# Patient Record
Sex: Male | Born: 1990 | Race: White | Hispanic: No | Marital: Single | Smoking: Current every day smoker
Health system: Southern US, Community
[De-identification: ages and names within clinical notes are randomized; demographics above are authoritative.]

## PROBLEM LIST (undated history)

## (undated) DIAGNOSIS — R569 Unspecified convulsions: Secondary | ICD-10-CM

## (undated) DIAGNOSIS — K2970 Gastritis, unspecified, without bleeding: Secondary | ICD-10-CM

## (undated) HISTORY — PX: TONSILLECTOMY: SUR1361

## (undated) HISTORY — DX: Gastritis, unspecified, without bleeding: K29.70

---
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
## 2009-01-29 ENCOUNTER — Emergency Department: Payer: Self-pay

## 2009-02-09 ENCOUNTER — Emergency Department (HOSPITAL_COMMUNITY): Admission: EM | Admit: 2009-02-09 | Discharge: 2009-02-09 | Payer: Self-pay | Admitting: Emergency Medicine

## 2009-07-04 ENCOUNTER — Inpatient Hospital Stay: Payer: Self-pay | Admitting: Internal Medicine

## 2009-07-06 ENCOUNTER — Inpatient Hospital Stay: Payer: Self-pay | Admitting: Psychiatry

## 2009-07-19 ENCOUNTER — Other Ambulatory Visit: Payer: Self-pay | Admitting: Psychiatry

## 2009-08-27 ENCOUNTER — Emergency Department: Payer: Self-pay | Admitting: Emergency Medicine

## 2009-10-22 ENCOUNTER — Emergency Department (HOSPITAL_COMMUNITY)
Admission: EM | Admit: 2009-10-22 | Discharge: 2009-10-23 | Payer: Self-pay | Source: Home / Self Care | Admitting: Emergency Medicine

## 2009-11-10 ENCOUNTER — Ambulatory Visit: Payer: Self-pay | Admitting: Unknown Physician Specialty

## 2009-11-19 ENCOUNTER — Ambulatory Visit: Payer: Self-pay | Admitting: Unknown Physician Specialty

## 2010-05-04 LAB — RAPID URINE DRUG SCREEN, HOSP PERFORMED
Barbiturates: NOT DETECTED
Benzodiazepines: NOT DETECTED

## 2010-05-04 LAB — URINE MICROSCOPIC-ADD ON

## 2010-05-04 LAB — DIFFERENTIAL
Basophils Absolute: 0 10*3/uL (ref 0.0–0.1)
Eosinophils Relative: 2 % (ref 0–5)
Lymphocytes Relative: 15 % (ref 12–46)
Lymphs Abs: 2.2 10*3/uL (ref 0.7–4.0)
Neutrophils Relative %: 76 % (ref 43–77)

## 2010-05-04 LAB — COMPREHENSIVE METABOLIC PANEL
AST: 23 U/L (ref 0–37)
BUN: 6 mg/dL (ref 6–23)
CO2: 23 mEq/L (ref 19–32)
Calcium: 8.8 mg/dL (ref 8.4–10.5)
Chloride: 106 mEq/L (ref 96–112)
Creatinine, Ser: 1.14 mg/dL (ref 0.4–1.5)
GFR calc Af Amer: >60 mL/min (ref >60)
GFR calc non Af Amer: >60 mL/min (ref >60)
Glucose, Bld: 68 mg/dL — ABNORMAL LOW (ref 70–99)
Total Bilirubin: 0.5 mg/dL (ref 0.3–1.2)

## 2010-05-04 LAB — URINALYSIS, ROUTINE W REFLEX MICROSCOPIC
Bilirubin Urine: NEGATIVE
Glucose, UA: NEGATIVE mg/dL
Hgb urine dipstick: NEGATIVE
Specific Gravity, Urine: 1.019 (ref 1.005–1.030)
Urobilinogen, UA: 1 mg/dL (ref 0.0–1.0)

## 2010-05-04 LAB — CBC
HCT: 44.2 % (ref 39.0–52.0)
Hemoglobin: 15.9 g/dL (ref 13.0–17.0)
MCH: 32.6 pg (ref 26.0–34.0)
MCHC: 36 g/dL (ref 30.0–36.0)
MCV: 90.6 fL (ref 78.0–100.0)
RBC: 4.88 MIL/uL (ref 4.22–5.81)

## 2010-05-04 LAB — ETHANOL: Alcohol, Ethyl (B): <5 mg/dL (ref 0–10)

## 2010-05-22 LAB — RAPID URINE DRUG SCREEN, HOSP PERFORMED
Amphetamines: NOT DETECTED
Barbiturates: NOT DETECTED
Opiates: NOT DETECTED
Tetrahydrocannabinol: POSITIVE — AB

## 2010-05-22 LAB — DIFFERENTIAL
Basophils Absolute: 0.1 10*3/uL (ref 0.0–0.1)
Lymphocytes Relative: 12 % (ref 12–46)
Lymphs Abs: 1.5 10*3/uL (ref 0.7–4.0)
Neutrophils Relative %: 82 % — ABNORMAL HIGH (ref 43–77)

## 2010-05-22 LAB — COMPREHENSIVE METABOLIC PANEL
BUN: 1 mg/dL — ABNORMAL LOW (ref 6–23)
CO2: 25 mEq/L (ref 19–32)
Calcium: 9 mg/dL (ref 8.4–10.5)
Chloride: 106 mEq/L (ref 96–112)
Creatinine, Ser: 0.84 mg/dL (ref 0.4–1.5)
GFR calc Af Amer: >60 mL/min (ref >60)
GFR calc non Af Amer: >60 mL/min (ref >60)
Glucose, Bld: 92 mg/dL (ref 70–99)
Total Bilirubin: 0.5 mg/dL (ref 0.3–1.2)

## 2010-05-22 LAB — CBC
HCT: 44.5 % (ref 39.0–52.0)
Hemoglobin: 15.1 g/dL (ref 13.0–17.0)
MCHC: 34 g/dL (ref 30.0–36.0)
MCV: 92.5 fL (ref 78.0–100.0)
RBC: 4.81 MIL/uL (ref 4.22–5.81)

## 2010-05-22 LAB — GLUCOSE, CAPILLARY: Glucose-Capillary: 93 mg/dL (ref 70–99)

## 2010-05-27 LAB — BASIC METABOLIC PANEL
BUN: 7 mg/dL (ref 6–23)
CO2: 24 mEq/L (ref 19–32)
GFR calc non Af Amer: >60 mL/min (ref >60)
Glucose, Bld: 86 mg/dL (ref 70–99)
Potassium: 4 mEq/L (ref 3.5–5.1)
Sodium: 137 mEq/L (ref 135–145)

## 2010-05-27 LAB — URINALYSIS, ROUTINE W REFLEX MICROSCOPIC
Bilirubin Urine: NEGATIVE
Nitrite: NEGATIVE
Specific Gravity, Urine: 1.013 (ref 1.005–1.030)
Urobilinogen, UA: 0.2 mg/dL (ref 0.0–1.0)
pH: 5.5 (ref 5.0–8.0)

## 2010-05-27 LAB — RAPID URINE DRUG SCREEN, HOSP PERFORMED
Amphetamines: NOT DETECTED
Barbiturates: NOT DETECTED
Opiates: POSITIVE — AB

## 2010-05-27 LAB — DIFFERENTIAL
Basophils Absolute: 0.1 10*3/uL (ref 0.0–0.1)
Basophils Relative: 1 % (ref 0–1)
Eosinophils Absolute: 0.2 10*3/uL (ref 0.0–0.7)
Eosinophils Relative: 2 % (ref 0–5)
Monocytes Absolute: 0.4 10*3/uL (ref 0.1–1.0)

## 2010-05-27 LAB — CBC
HCT: 42.6 % (ref 39.0–52.0)
Hemoglobin: 14.9 g/dL (ref 13.0–17.0)
MCHC: 35.1 g/dL (ref 30.0–36.0)
MCV: 93.2 fL (ref 78.0–100.0)
Platelets: 172 10*3/uL (ref 150–400)
RDW: 12.8 % (ref 11.5–15.5)

## 2010-05-27 LAB — URINE MICROSCOPIC-ADD ON

## 2011-01-13 ENCOUNTER — Observation Stay (HOSPITAL_COMMUNITY)
Admission: EM | Admit: 2011-01-13 | Discharge: 2011-01-15 | DRG: 182 | Disposition: A | Discharge status: Home / Self Care | Payer: BC Managed Care – PPO | Attending: Internal Medicine | Admitting: Internal Medicine

## 2011-01-13 ENCOUNTER — Encounter: Payer: Self-pay | Admitting: *Deleted

## 2011-01-13 DIAGNOSIS — E871 Hypo-osmolality and hyponatremia: Secondary | ICD-10-CM | POA: Insufficient documentation

## 2011-01-13 DIAGNOSIS — E860 Dehydration: Secondary | ICD-10-CM

## 2011-01-13 DIAGNOSIS — F17200 Nicotine dependence, unspecified, uncomplicated: Secondary | ICD-10-CM | POA: Diagnosis present

## 2011-01-13 DIAGNOSIS — E876 Hypokalemia: Secondary | ICD-10-CM

## 2011-01-13 DIAGNOSIS — E878 Other disorders of electrolyte and fluid balance, not elsewhere classified: Secondary | ICD-10-CM | POA: Diagnosis present

## 2011-01-13 DIAGNOSIS — R1110 Vomiting, unspecified: Secondary | ICD-10-CM

## 2011-01-13 DIAGNOSIS — G40909 Epilepsy, unspecified, not intractable, without status epilepticus: Secondary | ICD-10-CM | POA: Insufficient documentation

## 2011-01-13 DIAGNOSIS — K2900 Acute gastritis without bleeding: Principal | ICD-10-CM | POA: Diagnosis present

## 2011-01-13 HISTORY — DX: Unspecified convulsions: R56.9

## 2011-01-13 LAB — COMPREHENSIVE METABOLIC PANEL
ALT: 26 U/L (ref 0–53)
AST: 18 U/L (ref 0–37)
Albumin: 5.4 g/dL — ABNORMAL HIGH (ref 3.5–5.2)
Calcium: 10.6 mg/dL — ABNORMAL HIGH (ref 8.4–10.5)
Chloride: 80 mEq/L — ABNORMAL LOW (ref 96–112)
Creatinine, Ser: 1.13 mg/dL (ref 0.50–1.35)
Sodium: 126 mEq/L — ABNORMAL LOW (ref 135–145)
Total Bilirubin: 0.9 mg/dL (ref 0.3–1.2)

## 2011-01-13 LAB — URINALYSIS, ROUTINE W REFLEX MICROSCOPIC
Glucose, UA: NEGATIVE mg/dL
Hgb urine dipstick: NEGATIVE
Leukocytes, UA: NEGATIVE
pH: 5.5 (ref 5.0–8.0)

## 2011-01-13 LAB — DIFFERENTIAL
Basophils Absolute: 0 10*3/uL (ref 0.0–0.1)
Basophils Relative: 0 % (ref 0–1)
Lymphocytes Relative: 12 % (ref 12–46)
Monocytes Absolute: 1.2 10*3/uL — ABNORMAL HIGH (ref 0.1–1.0)
Neutro Abs: 13.1 10*3/uL — ABNORMAL HIGH (ref 1.7–7.7)
Neutrophils Relative %: 80 % — ABNORMAL HIGH (ref 43–77)

## 2011-01-13 LAB — CBC
HCT: 46.7 % (ref 39.0–52.0)
MCHC: 38.1 g/dL — ABNORMAL HIGH (ref 30.0–36.0)
Platelets: 310 10*3/uL (ref 150–400)
RDW: 12.2 % (ref 11.5–15.5)
WBC: 16.3 10*3/uL — ABNORMAL HIGH (ref 4.0–10.5)

## 2011-01-13 MED ORDER — ACETAMINOPHEN 325 MG PO TABS: 650.0000 mg | ORAL_TABLET | Freq: Four times a day (QID) | ORAL | Status: DC | PRN

## 2011-01-13 MED ORDER — POTASSIUM CHLORIDE 10 MEQ/100ML IV SOLN
10.0000 meq | INTRAVENOUS | Status: AC
  Administered 2011-01-13 – 2011-01-14 (×4): 10 meq via INTRAVENOUS
  Filled 2011-01-13 (×4): qty 100

## 2011-01-13 MED ORDER — MORPHINE SULFATE 4 MG/ML IJ SOLN
4.0000 mg | Freq: Once | INTRAMUSCULAR | Status: AC
  Administered 2011-01-13: 4 mg via INTRAVENOUS
  Filled 2011-01-13: qty 1

## 2011-01-13 MED ORDER — SODIUM CHLORIDE 0.9 % IV BOLUS (SEPSIS)
1500.0000 mL | Freq: Once | INTRAVENOUS | Status: AC
  Administered 2011-01-13: 1000 mL via INTRAVENOUS

## 2011-01-13 MED ORDER — ACETAMINOPHEN 650 MG RE SUPP: 650.0000 mg | Freq: Four times a day (QID) | RECTAL | Status: DC | PRN

## 2011-01-13 MED ORDER — PANTOPRAZOLE SODIUM 40 MG IV SOLR
40.0000 mg | Freq: Every day | INTRAVENOUS | Status: DC
  Administered 2011-01-13 – 2011-01-14 (×2): 40 mg via INTRAVENOUS
  Filled 2011-01-13 (×3): qty 40

## 2011-01-13 MED ORDER — NICOTINE 14 MG/24HR TD PT24
14.0000 mg | MEDICATED_PATCH | Freq: Every day | TRANSDERMAL | Status: DC
  Administered 2011-01-14: 14 mg via TRANSDERMAL
  Filled 2011-01-13 (×2): qty 1

## 2011-01-13 MED ORDER — ONDANSETRON HCL 4 MG/2ML IJ SOLN
4.0000 mg | Freq: Four times a day (QID) | INTRAMUSCULAR | Status: DC | PRN
  Administered 2011-01-13 – 2011-01-14 (×3): 4 mg via INTRAVENOUS
  Filled 2011-01-13 (×3): qty 2

## 2011-01-13 MED ORDER — POTASSIUM CHLORIDE 10 MEQ/100ML IV SOLN
10.0000 meq | INTRAVENOUS | Status: AC
  Administered 2011-01-13 (×2): 10 meq via INTRAVENOUS
  Filled 2011-01-13 (×2): qty 100

## 2011-01-13 MED ORDER — HYDROMORPHONE HCL PF 1 MG/ML IJ SOLN
1.0000 mg | INTRAMUSCULAR | Status: DC | PRN
  Administered 2011-01-13 – 2011-01-14 (×6): 1 mg via INTRAVENOUS
  Filled 2011-01-13 (×6): qty 1

## 2011-01-13 MED ORDER — SODIUM CHLORIDE 0.9 % IV SOLN
INTRAVENOUS | Status: DC
  Administered 2011-01-14 – 2011-01-15 (×3): via INTRAVENOUS

## 2011-01-13 MED ORDER — ONDANSETRON HCL 4 MG PO TABS: 4.0000 mg | ORAL_TABLET | Freq: Four times a day (QID) | ORAL | Status: DC | PRN

## 2011-01-13 MED ORDER — ONDANSETRON HCL 4 MG/2ML IJ SOLN
8.0000 mg | INTRAMUSCULAR | Status: DC | PRN
  Administered 2011-01-13 (×2): 8 mg via INTRAVENOUS
  Filled 2011-01-13: qty 4
  Filled 2011-01-13: qty 2

## 2011-01-13 MED ORDER — SODIUM CHLORIDE 0.9 % IV SOLN
INTRAVENOUS | Status: DC
  Administered 2011-01-13: 14:00:00 via INTRAVENOUS

## 2011-01-13 MED ORDER — ENOXAPARIN SODIUM 40 MG/0.4ML ~~LOC~~ SOLN
40.0000 mg | SUBCUTANEOUS | Status: DC
  Administered 2011-01-13 – 2011-01-14 (×2): 40 mg via SUBCUTANEOUS
  Filled 2011-01-13 (×4): qty 0.4

## 2011-01-13 NOTE — ED Notes (Signed)
Pt with N/V for 3 days, chills with symptoms, has not been able to keep liquids in for 3 days

## 2011-01-13 NOTE — H&P (Signed)
PCP:   No primary provider on file.  Primary Neurologist: [facility].   Chief Complaint:  Vomiting and abdominal pain for 3 days.  HPI: This is a 20year male, with known history of seizure disorder diagnosed approximately 2 years ago, s/p tonsilectomy at age 20 years, presenting with a three-day history of vomiting, approximately 6-7 times per day, associated with abdominal pain non-radiating, constant non-fluctuating. He had no associated diarrhea, no history of sick contacts, clustering of cases, or antecedent ingestion of unusual foods or recent travel. He has been unable to keep down any food in that period of time and has come to the emergency department, because of persisting symptoms.  Allergies:  No Known Allergies    Past Medical History  Diagnosis Date  . Seizures     History reviewed. No pertinent past surgical history.  Prior to Admission medications   Medication Sig Start Date End Date Taking? Authorizing Provider  promethazine (PHENERGAN) 25 MG tablet Take 25 mg by mouth every 6 (six) hours as needed.     Yes Historical Provider, MD    Social History: Smokes half a pack of cigarettes per day.  He does not have any smokeless tobacco history on file. He reports that he does not drink alcohol or use illicit drugs. He is single, unemployed, has no offspring and live s with his mother. Parents were separated about 2 years ago.  Family History: Mother is aged 59 years, with COPD, father is aged 33 year, and is alive and well.  Review of Systems:  As per HPI and chief complaint. Patent feels weak, has diminished appetite, no weight loss or fever. Had chills 3 days, ago. Denies headache, blurred vision, difficulty in speaking, dysphagia, chest pain, cough, shortness of breath, orthopnea, paroxysmal nocturnal dyspnea, nausea, diaphoresis, diarrhea, hematemesis, melena, dysuria, nocturia, urinary frequency, hematochezia, lower extremity swelling, pain, or  redness. The rest of the systems review is negative.  Physical Exam:  General:  Patient does not appear to be in obvious acute distress. Alert, communicative, fully oriented, talking in complete sentences, not short of breath at rest.  HEENT:  No clinical pallor, no jaundice, no conjunctival injection or discharge. Visible mucosae appear dry. NECK:  Supple, JVP not seen, no carotid bruits, no palpable lymphadenopathy, no palpable goiter. CHEST:  Clinically clear to auscultation, no wheezes, no crackles. HEART:  Sounds 1 and 2 heard, normal, regular, no murmurs. ABDOMEN:  Flat, soft, non-tender, no palpable organomegaly, no palpable masses, normal bowel sounds. GENITALIA:  Not examined. LOWER EXTREMITIES:  No pitting edema, palpable peripheral pulses. MUSCULOSKELETAL SYSTEM:  Unremarkable. CENTRAL NERVOUS SYSTEM:  No focal neurologic deficit on gross examination.  Labs on Admission:  Results for orders placed during the hospital encounter of 01/13/11 (from the past 48 hour(s))  URINALYSIS, ROUTINE W REFLEX MICROSCOPIC     Status: Abnormal   Collection Time   01/13/11 12:09 PM      Component Value Range Comment   Color, Urine YELLOW  YELLOW     Appearance CLEAR  CLEAR     Specific Gravity, Urine 1.028  1.005 - 1.030     pH 5.5  5.0 - 8.0     Glucose, UA NEGATIVE  NEGATIVE (mg/dL)    Hgb urine dipstick NEGATIVE  NEGATIVE     Bilirubin Urine SMALL (*) NEGATIVE     Ketones, ur >80 (*) NEGATIVE (mg/dL)    Protein, ur NEGATIVE  NEGATIVE (mg/dL)    Urobilinogen, UA 0.2  0.0 -  1.0 (mg/dL)    Nitrite NEGATIVE  NEGATIVE     Leukocytes, UA NEGATIVE  NEGATIVE  MICROSCOPIC NOT DONE ON URINES WITH NEGATIVE PROTEIN, BLOOD, LEUKOCYTES, NITRITE, OR GLUCOSE <1000 mg/dL.  CBC     Status: Abnormal   Collection Time   01/13/11 12:30 PM      Component Value Range Comment   WBC 16.3 (*) 4.0 - 10.5 (K/uL)    RBC 5.65  4.22 - 5.81 (MIL/uL)    Hemoglobin 17.8 (*) 13.0 - 17.0 (g/dL)    HCT 78.2  95.6 -  21.3 (%)    MCV 82.7  78.0 - 100.0 (fL)    MCH 31.5  26.0 - 34.0 (pg)    MCHC 38.1 (*) 30.0 - 36.0 (g/dL)    RDW 08.6  57.8 - 46.9 (%)    Platelets 310  150 - 400 (K/uL)   DIFFERENTIAL     Status: Abnormal   Collection Time   01/13/11 12:30 PM      Component Value Range Comment   Neutrophils Relative 80 (*) 43 - 77 (%)    Neutro Abs 13.1 (*) 1.7 - 7.7 (K/uL)    Lymphocytes Relative 12  12 - 46 (%)    Lymphs Abs 2.0  0.7 - 4.0 (K/uL)    Monocytes Relative 7  3 - 12 (%)    Monocytes Absolute 1.2 (*) 0.1 - 1.0 (K/uL)    Eosinophils Relative 0  0 - 5 (%)    Eosinophils Absolute 0.0  0.0 - 0.7 (K/uL)    Basophils Relative 0  0 - 1 (%)    Basophils Absolute 0.0  0.0 - 0.1 (K/uL)   COMPREHENSIVE METABOLIC PANEL     Status: Abnormal   Collection Time   01/13/11 12:30 PM      Component Value Range Comment   Sodium 126 (*) 135 - 145 (mEq/L)    Potassium 3.0 (*) 3.5 - 5.1 (mEq/L)    Chloride 80 (*) 96 - 112 (mEq/L)    CO2 25  19 - 32 (mEq/L)    Glucose, Bld 105 (*) 70 - 99 (mg/dL)    BUN 39 (*) 6 - 23 (mg/dL)    Creatinine, Ser 6.29  0.50 - 1.35 (mg/dL)    Calcium 52.8 (*) 8.4 - 10.5 (mg/dL)    Total Protein 8.7 (*) 6.0 - 8.3 (g/dL)    Albumin 5.4 (*) 3.5 - 5.2 (g/dL)    AST 18  0 - 37 (U/L)    ALT 26  0 - 53 (U/L)    Alkaline Phosphatase 74  39 - 117 (U/L)    Total Bilirubin 0.9  0.3 - 1.2 (mg/dL)    GFR calc non Af Amer >90  >90 (mL/min)    GFR calc Af Amer >90  >90 (mL/min)   LIPASE, BLOOD     Status: Abnormal   Collection Time   01/13/11 12:30 PM      Component Value Range Comment   Lipase 9 (*) 11 - 59 (U/L)     Radiological Exams on Admission: No results found.  Assessment/Plan *Principal Problem* Acute Gastritis: Patient presents with acute gastritis. Suspect viral etiology, and although patient has elevated WCC and HB, this is likely due to dehydration and hemoconcentration. Lipase is normal at 9, effectively excluding acute pancreatitis. We shall admit patient  because of persistent symptomatology, commence bowel rest,  intravenous fluid hydration, proton pump inhibitor and antiemetics. Active hospital problems. 1. Dehydration: As evidenced by elevated BUN/Creatinine  ratio. This will be adequately addressed with iv fluids, as described above. 2. Electrolyte abnormalities. Patient has hyponatremia, hypokalemia and hypochloremia, all secondary to dehydration and volume depletion. These will be addressed with normal saline infusion, as well as potassium supplements. For completeness, we shall check magnesium levels, and replete if indicated. 3. Seizure disorder. Patient is asymptomatic. He has been seizure-free for approximately one year, and was compliant with medication, up to 3 days ago, but has had no anticonvulsants since. Unfortunately, he is sure of neither the name or dose of his medication. We shall place him on seizure precautions, and re-start anticonvulsants, as soon as these are known. 4. Smoking history: Patient has been counseled approriately, and will be managed with Nicoderm CQ patch, during his hospitalization.  Time Spent on Admission: 1 hour.  Trany Chernick,CHRISTOPHER 01/13/2011, 4:37 PM

## 2011-01-13 NOTE — ED Notes (Signed)
Pt. With abdominal pain. [facility] called for pain medication

## 2011-01-13 NOTE — ED Provider Notes (Cosign Needed)
History     CSN: [id number] Arrival date & time: 01/13/2011 10:27 AM   First MD Initiated Contact with Patient 01/13/11 1127      Chief Complaint  Patient presents with  . Nausea  . Abdominal Pain    (Consider location/radiation/quality/duration/timing/severity/associated sxs/prior treatment) HPI  Relates for the past 3 days he's had nausea and vomiting every 2 hours. He denies diarrhea. He states he has some upper nominal discomfort that is dull and diffuse and constant. He states he feels hot and cold and is unsure if he is having fever. He states he feels very weak and lightheaded. He denies cough shortness of breath chest pain. Nothing makes him feel worse. Mother has been giving him her prescription Phenergan oral and suppositories without relief. He states no new exposures and states nobody else is sick that he's been around.  Primary care doctor none  Past Medical History  Diagnosis Date  . Seizures    Medications none  History reviewed. No pertinent past surgical history.  No family history on file.  History  Substance Use Topics  . Smoking status: Current Everyday Smoker  . Smokeless tobacco: Not on file  . Alcohol Use: No   Unemployed   Review of Systems  All other systems reviewed and are negative.    Allergies  Review of patient's allergies indicates no known allergies.  Home Medications   Current Outpatient Rx  Name Route Sig Dispense Refill  . PROMETHAZINE HCL 25 MG PO TABS Oral Take 25 mg by mouth every 6 (six) hours as needed.        BP 158/93  Pulse 89  Temp(Src) 98.3 F (36.8 C) (Oral)  Resp 20  SpO2 97%  Vital signs normal  Physical Exam  Vitals reviewed. Constitutional: He is oriented to person, place, and time. He appears well-developed and well-nourished.  HENT:  Head: Normocephalic and atraumatic.  Right Ear: External ear normal.  Left Ear: External ear normal.       It is membranes are dry otherwise normal  Eyes:  Conjunctivae and EOM are normal. Pupils are equal, round, and reactive to light.  Neck: Normal range of motion. Neck supple.  Cardiovascular: Normal rate, regular rhythm and normal heart sounds.   Pulmonary/Chest: Effort normal and breath sounds normal.  Abdominal: Soft. Bowel sounds are normal.       Mild tenderness in the upper abdomen without guarding or rebound  Musculoskeletal: Normal range of motion.  Neurological: He is alert and oriented to person, place, and time.  Skin: Skin is warm and dry.  Psychiatric:       Affect is very flat    ED Course  Procedures (including critical care time)  Patient given IV fluids IV nausea medicine. After reviewing his labs he was started on IV potassium for his hypokalemia. He has significant electrolyte abnormalities it was felt he should be in for further treatment.  1528 Dr. Brien Few accepts for admission to a medical bed team two  Results for orders placed during the hospital encounter of 01/13/11  CBC      Component Value Range   WBC 16.3 (*) 4.0 - 10.5 (K/uL)   RBC 5.65  4.22 - 5.81 (MIL/uL)   Hemoglobin 17.8 (*) 13.0 - 17.0 (g/dL)   HCT 30.8  65.7 - 84.6 (%)   MCV 82.7  78.0 - 100.0 (fL)   MCH 31.5  26.0 - 34.0 (pg)   MCHC 38.1 (*) 30.0 - 36.0 (g/dL)   RDW  12.2  11.5 - 15.5 (%)   Platelets 310  150 - 400 (K/uL)  DIFFERENTIAL      Component Value Range   Neutrophils Relative 80 (*) 43 - 77 (%)   Neutro Abs 13.1 (*) 1.7 - 7.7 (K/uL)   Lymphocytes Relative 12  12 - 46 (%)   Lymphs Abs 2.0  0.7 - 4.0 (K/uL)   Monocytes Relative 7  3 - 12 (%)   Monocytes Absolute 1.2 (*) 0.1 - 1.0 (K/uL)   Eosinophils Relative 0  0 - 5 (%)   Eosinophils Absolute 0.0  0.0 - 0.7 (K/uL)   Basophils Relative 0  0 - 1 (%)   Basophils Absolute 0.0  0.0 - 0.1 (K/uL)  COMPREHENSIVE METABOLIC PANEL      Component Value Range   Sodium 126 (*) 135 - 145 (mEq/L)   Potassium 3.0 (*) 3.5 - 5.1 (mEq/L)   Chloride 80 (*) 96 - 112 (mEq/L)   CO2 25  19 - 32  (mEq/L)   Glucose, Bld 105 (*) 70 - 99 (mg/dL)   BUN 39 (*) 6 - 23 (mg/dL)   Creatinine, Ser 4.54  0.50 - 1.35 (mg/dL)   Calcium 09.8 (*) 8.4 - 10.5 (mg/dL)   Total Protein 8.7 (*) 6.0 - 8.3 (g/dL)   Albumin 5.4 (*) 3.5 - 5.2 (g/dL)   AST 18  0 - 37 (U/L)   ALT 26  0 - 53 (U/L)   Alkaline Phosphatase 74  39 - 117 (U/L)   Total Bilirubin 0.9  0.3 - 1.2 (mg/dL)   GFR calc non Af Amer >90  >90 (mL/min)   GFR calc Af Amer >90  >90 (mL/min)  LIPASE, BLOOD      Component Value Range   Lipase 9 (*) 11 - 59 (U/L)  URINALYSIS, ROUTINE W REFLEX MICROSCOPIC      Component Value Range   Color, Urine YELLOW  YELLOW    Appearance CLEAR  CLEAR    Specific Gravity, Urine 1.028  1.005 - 1.030    pH 5.5  5.0 - 8.0    Glucose, UA NEGATIVE  NEGATIVE (mg/dL)   Hgb urine dipstick NEGATIVE  NEGATIVE    Bilirubin Urine SMALL (*) NEGATIVE    Ketones, ur >80 (*) NEGATIVE (mg/dL)   Protein, ur NEGATIVE  NEGATIVE (mg/dL)   Urobilinogen, UA 0.2  0.0 - 1.0 (mg/dL)   Nitrite NEGATIVE  NEGATIVE    Leukocytes, UA NEGATIVE  NEGATIVE    Laboratory interpretation hyponatremia, hypokalemia, low chloride urine concentrated with ketones consistent with dehydration, leukocytosis    Diagnoses that have been ruled out:  Diagnoses that are still under consideration:  Final diagnoses:  Vomiting  Dehydration  Hyponatremia  Hypokalemia    Plan admission Devoria Albe, MD, FACEP    MDM          Ward Givens, MD 01/13/11 1538

## 2011-01-14 ENCOUNTER — Encounter (HOSPITAL_COMMUNITY): Payer: Self-pay

## 2011-01-14 LAB — CBC
MCHC: 35.3 g/dL (ref 30.0–36.0)
Platelets: 215 10*3/uL (ref 150–400)
RDW: 12.4 % (ref 11.5–15.5)
WBC: 10.3 10*3/uL (ref 4.0–10.5)

## 2011-01-14 LAB — COMPREHENSIVE METABOLIC PANEL
ALT: 34 U/L (ref 0–53)
Alkaline Phosphatase: 54 U/L (ref 39–117)
BUN: 18 mg/dL (ref 6–23)
CO2: 25 mEq/L (ref 19–32)
GFR calc Af Amer: >90 mL/min (ref >90)
GFR calc non Af Amer: >90 mL/min (ref >90)
Glucose, Bld: 85 mg/dL (ref 70–99)
Potassium: 3.7 mEq/L (ref 3.5–5.1)
Sodium: 132 mEq/L — ABNORMAL LOW (ref 135–145)
Total Bilirubin: 0.6 mg/dL (ref 0.3–1.2)

## 2011-01-14 LAB — MAGNESIUM: Magnesium: 2.4 mg/dL (ref 1.5–2.5)

## 2011-01-14 NOTE — Progress Notes (Signed)
Subjective: Feels much better today. No further vomiting since admission. Ready to commence oral intake.  Objective: Vital signs in last 24 hours: Temp:  [97.8 F (36.6 C)-99.3 F (37.4 C)] 97.8 F (36.6 C) (11/25 1334) Pulse Rate:  [66-84] 68  (11/25 1334) Resp:  [18-20] 18  (11/25 1334) BP: (141-152)/(78-86) 151/86 mmHg (11/25 1334) SpO2:  [97 %] 97 % (11/25 1334) Weight:  [69.9 kg (154 lb 1.6 oz)] 154 lb 1.6 oz (69.9 kg) (11/24 2112) Weight change:  Last BM Date: 01/11/11  Intake/Output from previous day: 11/24 0701 - 11/25 0700 In: 379 [I.V.:279; IV Piggyback:100] Out: -      Physical Exam: General: Patient does not appear to be in obvious acute distress. Alert, communicative, fully oriented, talking in complete sentences, not short of breath at rest.  HEENT: No clinical pallor, no jaundice, no conjunctival injection or discharge. Hydration status is fair  NECK: Supple, JVP not seen, no carotid bruits, no palpable lymphadenopathy, no palpable goiter.  CHEST: Clinically clear to auscultation, no wheezes, no crackles.  HEART: Sounds 1 and 2 heard, normal, regular, no murmurs.  ABDOMEN: Flat, soft, non-tender, no palpable organomegaly, no palpable masses, normal bowel sounds.  GENITALIA: Not examined.  LOWER EXTREMITIES: No pitting edema, palpable peripheral pulses.  MUSCULOSKELETAL SYSTEM: Unremarkable.  CENTRAL NERVOUS SYSTEM: No focal neurologic deficit on gross examination.     Lab Results:  Basename 01/14/11 0708 01/13/11 1230  WBC 10.3 16.3*  HGB 14.1 17.8*  HCT 40.0 46.7  PLT 215 310    Basename 01/14/11 0708 01/13/11 1230  NA 132* 126*  K 3.7 3.0*  CL 96 80*  CO2 25 25  GLUCOSE 85 105*  BUN 18 39*  CREATININE 0.92 1.13  CALCIUM 8.6 10.6*   No results found for this or any previous visit (from the past 240 hour(s)).   Studies/Results: No results found.  Medications: Scheduled Meds:   . enoxaparin  40 mg Subcutaneous Q24H  .  morphine injection   4 mg Intravenous Once  . nicotine  14 mg Transdermal Daily  . pantoprazole (PROTONIX) IV  40 mg Intravenous QHS  . potassium chloride  10 mEq Intravenous Q1 Hr x 2  . potassium chloride  10 mEq Intravenous Q1 Hr x 4   Continuous Infusions:   . sodium chloride 125 mL/hr at 01/14/11 0731  . DISCONTD: sodium chloride 100 mL/hr at 01/13/11 1413   PRN Meds:.acetaminophen, acetaminophen, HYDROmorphone, ondansetron (ZOFRAN) IV, ondansetron, DISCONTD: ondansetron  Assessment/Plan: *Principal Problem*  Acute Gastritis: Likely viral etiology. Now asymptomatic with supportive treatment. WCC and HB have normalized. Lipase is normal at 9, effectively excluding acute pancreatitis. Continue intravenous fluid hydration, proton pump inhibitor and antiemetics. We shall advance diet as tolerated. Active hospital problems.  1. Dehydration: Resolved with iv fluids. We shall reduce ivi NS to 100 cc/hr. 2. Electrolyte abnormalities. Hyponatremia has practically resolved, while, hypokalemia and hypochloremia have resolved.  3. Seizure disorder. Patient is asymptomatic. He has been seizure-free for approximately one year, and now admits that he has been non-compliant with medication, for about 2 months, but has had no seizures since. Unfortunately, he is sure of neither the name or dose of his medication. We shall strive to contact his primary neurologist on 01/15/11, to discuss..  4. Smoking history: Patient has been counseled approriately, and is on Nicoderm CQ patch.   Comment Possible DC on 01/15/11, if remains asymptomatic.    LOS: 1 day   Jared Gay,CHRISTOPHER 01/14/2011, 1:51 PM

## 2011-01-15 LAB — BASIC METABOLIC PANEL
CO2: 26 mEq/L (ref 19–32)
Chloride: 102 mEq/L (ref 96–112)
Potassium: 3.6 mEq/L (ref 3.5–5.1)
Sodium: 136 mEq/L (ref 135–145)

## 2011-01-15 LAB — CBC
MCV: 88 fL (ref 78.0–100.0)
Platelets: 193 10*3/uL (ref 150–400)
RBC: 4.76 MIL/uL (ref 4.22–5.81)
WBC: 6.8 10*3/uL (ref 4.0–10.5)

## 2011-01-15 MED ORDER — HYDROMORPHONE HCL PF 2 MG/ML IJ SOLN
INTRAMUSCULAR | Status: AC
  Administered 2011-01-15: 1 mg
  Filled 2011-01-15: qty 1

## 2011-01-15 MED ORDER — PANTOPRAZOLE SODIUM 40 MG PO TBEC: 40.0000 mg | DELAYED_RELEASE_TABLET | Freq: Every day | ORAL | Status: DC

## 2011-01-15 NOTE — Discharge Summary (Signed)
Physician Discharge Summary  Patient ID: [name] MRN: [id number] DOB/AGE: 1990/12/07 20 y.o.  Admit date: 01/13/2011 Discharge date: 01/15/2011  Primary Care Physician:  No primary provider on file.   Discharge Diagnoses:    Patient Active Problem List  Diagnoses  . Acute gastritis  . Dehydration  . Electrolyte abnormality  . Seizure disorder  . Active smoker    Current Discharge Medication List    CONTINUE these medications which have NOT CHANGED   Details  promethazine (PHENERGAN) 25 MG tablet Take 25 mg by mouth every 6 (six) hours as needed.           Disposition and Follow-up:  Continue routine follow up with Primary neurologist. Recommended to establish Primary MD. Consults:  none None.  Significant Diagnostic Studies:  No results found.  Brief H and P: For complete details, refer to admission H and P. However,  in brief, this is a 20year male, with known history of seizure disorder diagnosed approximately 2 years ago, s/p tonsilectomy at age 11 years, presenting with a three-day history of vomiting, approximately 6-7 times per day, associated with abdominal pain non-radiating, constant non-fluctuating. He had no associated diarrhea, no history of sick contacts, clustering of cases, or antecedent ingestion of unusual foods or recent travel. He was admitted for further evaluation, investigation and management.    Physical Exam: On 01/15/11. General: Patient does not appear to be in obvious acute distress. Alert, communicative, fully oriented, talking in complete sentences, not short of breath at rest. Tolerated regular diet, completely asymptomatic. HEENT: No clinical pallor, no jaundice, no conjunctival injection or discharge. Hydration status is fair  NECK: Supple, JVP not seen, no carotid bruits, no palpable lymphadenopathy, no palpable goiter.  CHEST: Clinically clear to auscultation, no wheezes, no crackles.  HEART: Sounds 1 and 2 heard, normal,  regular, no murmurs.  ABDOMEN: Flat, soft, non-tender, no palpable organomegaly, no palpable masses, normal bowel sounds.  GENITALIA: Not examined.  LOWER EXTREMITIES: No pitting edema, palpable peripheral pulses.  MUSCULOSKELETAL SYSTEM: Unremarkable.  CENTRAL NERVOUS SYSTEM: No focal neurologic deficit on gross examination.    Hospital Course:  Principal Problem*  Acute Gastritis: Patient was managed with bowel rest, intravenous fluid hydration, proton pump inhibitor and antiemetics, with satisfactory clinical response. By 01/14/11, patient was asymptomatic. Diet was advanced and tolerated. Gastritis was likely viral etiology. WCC and HB have normalized. Lipase is normal at 9.  Active hospital problems.  1. Dehydration: This was secondary to Gastritis, and resolved with iv fluids.  2. Electrolyte abnormalities. Hyponatremia, hypokalemia and hypochloremia have resolved, with utilization of normal saline and potassium supplementation.  3. Seizure disorder. Patient was asymptomatic during this hospitalization. He has been seizure-free for approximately one year, and now admits that he has been non-compliant with medication, for about 9 months, but has had no seizures since. We have recommended that he call his primary Neurologist on discharge, to schedule an appointment. 4. Smoking history: Patient has been counseled approriately, and was managed with Nicoderm CQ patch.   Comment: Patient was considered stable for discharge on 01/15/11.    Time spent on Discharge: 35 mins.  Signed: Jerra Gay,CHRISTOPHER 01/15/2011, 10:51 AM

## 2011-01-15 NOTE — Progress Notes (Signed)
Spoke with patient and mother at bedside. States does not have PCP at this time. Offered [facility] and discussed calling customer service # on insurance card. Patient preferred calling insurance provider. Plans to call and make f/u appt after d/c. No other d/c needs identified. Appreciative of CM visit.

## 2011-02-07 ENCOUNTER — Encounter: Payer: Self-pay | Admitting: *Deleted

## 2011-02-08 ENCOUNTER — Encounter: Payer: Self-pay | Admitting: Internal Medicine

## 2011-02-08 ENCOUNTER — Ambulatory Visit (INDEPENDENT_AMBULATORY_CARE_PROVIDER_SITE_OTHER): Payer: BC Managed Care – PPO | Admitting: Internal Medicine

## 2011-02-08 VITALS — BP 132/64 | HR 80 | Ht 72.0 in | Wt 161.0 lb

## 2011-02-08 DIAGNOSIS — R1013 Epigastric pain: Secondary | ICD-10-CM

## 2011-02-08 MED ORDER — HYOSCYAMINE-PHENYLTOLOXAMINE 0.0625-15 MG PO CAPS: ORAL_CAPSULE | ORAL | Status: DC

## 2011-02-08 MED ORDER — PANTOPRAZOLE SODIUM 40 MG PO TBEC: 40.0000 mg | DELAYED_RELEASE_TABLET | Freq: Every day | ORAL | Status: DC

## 2011-02-08 NOTE — Patient Instructions (Signed)
You have been scheduled for an endoscopy. Please follow written instructions given to you at your visit today. We have sent the following medications to your pharmacy for you to pick up at your convenience: Protonix We have given you samples of Digex to take 1 capsule three times daily as needed.
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
## 2011-02-15 ENCOUNTER — Encounter: Payer: Self-pay | Admitting: Internal Medicine

## 2011-02-15 ENCOUNTER — Ambulatory Visit (AMBULATORY_SURGERY_CENTER): Payer: BC Managed Care – PPO | Admitting: Internal Medicine

## 2011-02-15 DIAGNOSIS — K319 Disease of stomach and duodenum, unspecified: Secondary | ICD-10-CM

## 2011-02-15 DIAGNOSIS — K2900 Acute gastritis without bleeding: Secondary | ICD-10-CM

## 2011-02-15 MED ORDER — HYOSCYAMINE-PHENYLTOLOXAMINE 0.0625-15 MG PO CAPS: 1.0000 | ORAL_CAPSULE | Freq: Two times a day (BID) | ORAL | Status: DC

## 2011-02-15 MED ORDER — SODIUM CHLORIDE 0.9 % IV SOLN: 500.0000 mL | INTRAVENOUS | Status: DC
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
## 2011-02-15 NOTE — Patient Instructions (Signed)
FOLLOW DISCHARGE INSTRUCTIONS (BLUE & GREEN SHEETS).   Information on gastritis given to you   Await biopsy results.  Continue anti reflux medication & Digex.  New prescription for Hyoscyamine (Digex) given to you by Dr. Juanda Chance.
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
## 2011-02-16 ENCOUNTER — Telehealth: Payer: Self-pay | Admitting: *Deleted

## 2011-02-16 NOTE — Telephone Encounter (Signed)
No answer, message left for the patient. 

## 2011-02-21 ENCOUNTER — Encounter: Payer: Self-pay | Admitting: Internal Medicine

## 2011-10-08 ENCOUNTER — Encounter (HOSPITAL_COMMUNITY): Payer: Self-pay | Admitting: Emergency Medicine

## 2011-10-08 ENCOUNTER — Emergency Department (HOSPITAL_COMMUNITY)
Admission: EM | Admit: 2011-10-08 | Discharge: 2011-10-08 | Disposition: A | Discharge status: Home / Self Care | Payer: BC Managed Care – PPO | Attending: Emergency Medicine | Admitting: Emergency Medicine

## 2011-10-08 ENCOUNTER — Emergency Department (HOSPITAL_COMMUNITY): Payer: BC Managed Care – PPO

## 2011-10-08 DIAGNOSIS — R569 Unspecified convulsions: Secondary | ICD-10-CM

## 2011-10-08 DIAGNOSIS — F17200 Nicotine dependence, unspecified, uncomplicated: Secondary | ICD-10-CM | POA: Insufficient documentation

## 2011-10-08 DIAGNOSIS — Z79899 Other long term (current) drug therapy: Secondary | ICD-10-CM | POA: Insufficient documentation

## 2011-10-08 DIAGNOSIS — G40909 Epilepsy, unspecified, not intractable, without status epilepticus: Secondary | ICD-10-CM | POA: Insufficient documentation

## 2011-10-08 DIAGNOSIS — J45909 Unspecified asthma, uncomplicated: Secondary | ICD-10-CM | POA: Insufficient documentation

## 2011-10-08 LAB — COMPREHENSIVE METABOLIC PANEL
ALT: 14 U/L (ref 0–53)
CO2: 19 mEq/L (ref 19–32)
Calcium: 9.1 mg/dL (ref 8.4–10.5)
Chloride: 101 mEq/L (ref 96–112)
Creatinine, Ser: 1.07 mg/dL (ref 0.50–1.35)
GFR calc Af Amer: >90 mL/min (ref >90)
GFR calc non Af Amer: >90 mL/min (ref >90)
Glucose, Bld: 109 mg/dL — ABNORMAL HIGH (ref 70–99)
Total Bilirubin: 0.4 mg/dL (ref 0.3–1.2)

## 2011-10-08 LAB — CBC WITH DIFFERENTIAL/PLATELET
Eosinophils Relative: 0 % (ref 0–5)
HCT: 46 % (ref 39.0–52.0)
Hemoglobin: 16.9 g/dL (ref 13.0–17.0)
Lymphocytes Relative: 6 % — ABNORMAL LOW (ref 12–46)
Lymphs Abs: 1.3 10*3/uL (ref 0.7–4.0)
MCV: 88 fL (ref 78.0–100.0)
Monocytes Absolute: 1.1 10*3/uL — ABNORMAL HIGH (ref 0.1–1.0)
RBC: 5.23 MIL/uL (ref 4.22–5.81)
WBC: 22.1 10*3/uL — ABNORMAL HIGH (ref 4.0–10.5)

## 2011-10-08 LAB — URINALYSIS, ROUTINE W REFLEX MICROSCOPIC
Bilirubin Urine: NEGATIVE
Nitrite: NEGATIVE
Protein, ur: 30 mg/dL — AB
Specific Gravity, Urine: 1.019 (ref 1.005–1.030)
Urobilinogen, UA: 0.2 mg/dL (ref 0.0–1.0)

## 2011-10-08 LAB — RAPID URINE DRUG SCREEN, HOSP PERFORMED
Opiates: NOT DETECTED
Tetrahydrocannabinol: POSITIVE — AB

## 2011-10-08 LAB — URINE MICROSCOPIC-ADD ON

## 2011-10-08 LAB — CK: Total CK: 400 U/L — ABNORMAL HIGH (ref 7–232)

## 2011-10-08 MED ORDER — LEVETIRACETAM 500 MG PO TABS
1000.0000 mg | ORAL_TABLET | Freq: Once | ORAL | Status: AC
  Administered 2011-10-08: 1000 mg via ORAL
  Filled 2011-10-08: qty 2

## 2011-10-08 MED ORDER — LEVETIRACETAM 500 MG PO TABS: 500.0000 mg | ORAL_TABLET | Freq: Two times a day (BID) | ORAL | Status: DC

## 2011-10-08 NOTE — Progress Notes (Signed)
Pt with [employer] coverage Pt given a list of pcps in area and [email or web address].  Discussed with pt need to be compliant with taking seizure medication when his grandmother mentioned that pt had stopped taking his medications for "8 months" Discussed use of recreational drugs with medications.  Pt voiced understanding and appreciation for resources offered.

## 2011-10-08 NOTE — ED Notes (Signed)
Pt was at friend's home smoking pot when he walked to the mailbox and had a seizure and fell.  Pt states he takes medication for his seizures, but not everyday.  Pt states he had an aura before his seizure, last seizure was a few months ago.  Friends told paramedics that seizure lasted 5 minutes, was post ictal on EMs arrival.  Pt alert and orriented at this time

## 2011-10-08 NOTE — ED Provider Notes (Signed)
History     CSN: [id number]  Arrival date & time 10/08/11  1504   First MD Initiated Contact with Patient 10/08/11 1719      Chief Complaint  Patient presents with  . Seizures  . Fall    (Consider location/radiation/quality/duration/timing/severity/associated sxs/prior treatment) HPI Comments: Jared Gay 21 y.o. male   The chief complaint is: Patient presents with:   Seizures   Fall    21 year old male with history of seizure disorder presents today status post tonic clonic seizure which lasted approximately 5 minutes. Patient states that he went outside in the next thing he knew he hit the ground. He arrived via EMS on long board with cervical precautions. He is a patient at [facility]. He has not taken his Keppra for about a year and a half.  He was in post ictal state and is unable to remember the events after the seizure. He fell to the ground and he states that he is tired and his muscles are sore, but he denies an head injury, or points of focal pain.  He denies any urinary symptoms, abdominal symptoms, or respiratory symptoms. Denies fevers, chills, arthralgias, nausea, vomiting, diarrhea. He denies ETOH intake, but does admit to smoking marijuana today before the seizure.  He denies intake of other illicit drugs.  He denies headaches or weakness.     Patient is a 21 y.o. male presenting with seizures and fall. The history is provided by the patient, a relative, medical records, the EMS personnel and a parent. No language interpreter was used.  Seizures  Pertinent negatives include no headaches, no cough, no nausea, no vomiting and no diarrhea.  Fall Pertinent negatives include no fever, no abdominal pain, no nausea, no vomiting and no headaches.    Past Medical History  Diagnosis Date  . Seizures   . Gastritis   . Asthma     Past Surgical History  Procedure Date  . Tonsillectomy     Family History  Problem Relation Age of Onset  .  Colon cancer Neg Hx     History  Substance Use Topics  . Smoking status: Current Everyday Smoker -- 0.5 packs/day    Types: Cigarettes  . Smokeless tobacco: Never Used  . Alcohol Use: No      Review of Systems  Constitutional: Negative for fever.  HENT: Negative for neck pain and neck stiffness.   Respiratory: Negative for cough and chest tightness.   Gastrointestinal: Negative for nausea, vomiting, abdominal pain, diarrhea and constipation.  Genitourinary: Negative for dysuria, urgency and frequency.  Musculoskeletal: Positive for myalgias. Negative for back pain, arthralgias and gait problem.  Skin: Negative for rash.  Neurological: Positive for seizures. Negative for weakness and headaches.    Allergies  Review of patient's allergies indicates no known allergies.  Home Medications   Current Outpatient Rx  Name Route Sig Dispense Refill  . LEVETIRACETAM 500 MG PO TABS Oral Take 1 tablet (500 mg total) by mouth 2 (two) times daily. 28 tablet 0    BP 133/76  Pulse 116  Temp 97.6 F (36.4 C) (Oral)  Resp 16  SpO2 98%  Physical Exam  Nursing note and vitals reviewed. Constitutional: He is oriented to person, place, and time. He appears well-developed and well-nourished. No distress.  HENT:  Head: Normocephalic and atraumatic.       No signs of injury to the head.  Eyes: Conjunctivae are normal. No scleral icterus.  Neck: Normal range of motion. Neck  supple.  Cardiovascular: Normal rate, regular rhythm and normal heart sounds.   Pulmonary/Chest: Effort normal and breath sounds normal. No respiratory distress.  Abdominal: Soft. There is no tenderness.  Musculoskeletal: He exhibits no edema.       No bruises, swelling or hematoma  Neurological: He is alert and oriented to person, place, and time. He has normal reflexes. He displays no tremor. No cranial nerve deficit (lateral nystagmus that fatigues after 7-8 beats,  Nystagmus is bilateral.Sligh anisocoria, L>R.  Accounted for after postion and lighting change. ) or sensory deficit. He exhibits normal muscle tone. He displays a negative Romberg sign. He displays no seizure activity. GCS eye subscore is 4. GCS verbal subscore is 5. GCS motor subscore is 6.  Skin: Skin is warm and dry. He is not diaphoretic.  Psychiatric: His behavior is normal.    ED Course  Procedures (including critical care time)  Labs Reviewed  CBC WITH DIFFERENTIAL - Abnormal; Notable for the following:    WBC 22.1 (*)     MCHC 36.7 (*)     Neutrophils Relative 89 (*)     Neutro Abs 19.5 (*)     Lymphocytes Relative 6 (*)     Monocytes Absolute 1.1 (*)     All other components within normal limits  COMPREHENSIVE METABOLIC PANEL - Abnormal; Notable for the following:    Glucose, Bld 109 (*)     All other components within normal limits  URINALYSIS, ROUTINE W REFLEX MICROSCOPIC - Abnormal; Notable for the following:    Hgb urine dipstick MODERATE (*)     Protein, ur 30 (*)     All other components within normal limits  URINE RAPID DRUG SCREEN (HOSP PERFORMED) - Abnormal; Notable for the following:    Tetrahydrocannabinol POSITIVE (*)     All other components within normal limits  URINE MICROSCOPIC-ADD ON - Abnormal; Notable for the following:    Squamous Epithelial / LPF FEW (*)     All other components within normal limits  CK - Abnormal; Notable for the following:    Total CK 400 (*)     All other components within normal limits  ETHANOL   Ct Head Wo Contrast  10/08/2011  *RADIOLOGY REPORT*  Clinical Data: Seizures, fell.  CT HEAD WITHOUT CONTRAST  Technique:  Contiguous axial images were obtained from the base of the skull through the vertex without contrast.  Comparison: 10/02/2008  Findings: There is no evidence of acute intracranial hemorrhage, brain edema, mass lesion, acute infarction,   mass effect, or midline shift. Acute infarct may be inapparent on noncontrast CT. No other intra-axial abnormalities are seen,  and the ventricles and sulci are within normal limits in size and symmetry.   No abnormal extra-axial fluid collections or masses are identified.  No significant calvarial abnormality.  IMPRESSION: 1. Negative for bleed or other acute intracranial process.   Original Report Authenticated By: Osa Craver, M.D.     Patient had removed his c-collar before I could get in to evaluate him.  BP 133/76  Pulse 116  Temp 97.6 F (36.4 C) (Oral)  Resp 16  SpO2 98% Patient with mild abnormilities of nystagmus and anisocoria or PE. Family says that they have not noticed these findings previously. I spoke with Dr. Silverio Lay who suggested  I order a CT of the Head to r/o any acute pathology which may account for this abnormality. Patient also has Protein and Hgb of UA, not at baseline.  I  have ordered a CPK to r/o rhabdomyolisis. Patient will get Keppra loading dose of 1000mg  while waiting.      Patient CPK elevated, but does not indicate Rhabdo and likely accounts for the hgb level on UA. CT is also negative. Dr.Yao spoke with patient about marijuana use lowering the seizure threshold.  I have spoken with patient about the need for medication compliance and monitoring by his neurologist. I feel the patient is safe to discharge at this time with neuro f/u.  I will d/c him with 2 weeks of Keppra 500 BID. 1. Seizure       MDM  Discussed plan with the patient and all questioned fully answered. . Discussed reasons to seek immediate care. Patient expresses understanding and agrees with plan.           Arthor Captain, PA-C 10/09/11 1208

## 2011-10-08 NOTE — Progress Notes (Signed)
[facility] ED CM reviewed with pt and family the process to obtain an in network [employer] provider via toll free number and or website.  Pt states previously saw a Dr Thad Ranger years ago but Dr Thad Ranger left his practice

## 2011-10-08 NOTE — ED Notes (Signed)
WUJ:[id number]<JY> Expected date:10/08/11<BR> Expected time: 2:44 PM<BR> Means of arrival:Ambulance<BR> Comments:<BR> Seizure-hx of same

## 2011-10-09 NOTE — ED Provider Notes (Signed)
Medical screening examination/treatment/procedure(s) were conducted as a shared visit with non-physician practitioner(s) and myself.  I personally evaluated the patient during the encounter  Jared BATTERSHELL is a 21 y.o. male hx of seizure with medication uncompliance here with seizure. He stopped his keppra for the last year and a half. He was also smoking marijuana as well. Today, he had a tonic clonic seizure and hit his head. At the time of interview, he is back to baseline. No fever or cough or abdominal pain or other complaints.   His vitals significant for tachycardia 116 otherwise stable. His exam showed no scalp hematoma. 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 Otherwise his neuro exam is nl. Labs  Significant for WBC 22 with nl UA, nl CMP, nl CT head. He has no URI symptoms so a cxr is not performed. His CK is 400.   I discussed at length with patient regarding his drug use and medication uncompliance. He was loaded with 1g keppra PO and given a prescription for keppra. His elevated WBC might be secondary to seizure vs drug use. I also counseled him to drink plenty of water given his CK is 400. He will follow up with his neurologist. He is also to return to ER if he has fever, abdominal pain, or any other signs of infection. Patient feels well and stable for d/c.    Richardean Canal, MD 10/09/11 1351

## 2012-06-25 ENCOUNTER — Telehealth: Payer: Self-pay | Admitting: Neurology

## 2012-06-25 MED ORDER — DIVALPROEX SODIUM ER 500 MG PO TB24: 500.0000 mg | ORAL_TABLET | Freq: Every day | ORAL | Status: DC

## 2012-08-14 ENCOUNTER — Ambulatory Visit (INDEPENDENT_AMBULATORY_CARE_PROVIDER_SITE_OTHER): Payer: BC Managed Care – PPO | Admitting: Nurse Practitioner

## 2012-08-14 ENCOUNTER — Encounter: Payer: Self-pay | Admitting: Nurse Practitioner

## 2012-08-14 VITALS — BP 115/61 | HR 82 | Ht 71.0 in | Wt 170.0 lb

## 2012-08-14 DIAGNOSIS — Z79899 Other long term (current) drug therapy: Secondary | ICD-10-CM

## 2012-08-14 DIAGNOSIS — R404 Transient alteration of awareness: Secondary | ICD-10-CM

## 2012-08-14 DIAGNOSIS — G40309 Generalized idiopathic epilepsy and epileptic syndromes, not intractable, without status epilepticus: Secondary | ICD-10-CM

## 2012-08-14 DIAGNOSIS — Z5181 Encounter for therapeutic drug level monitoring: Secondary | ICD-10-CM | POA: Insufficient documentation

## 2012-08-14 NOTE — Patient Instructions (Addendum)
Will check labs today Continue Depakote at current dose Followup yearly and when necessary
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
## 2012-08-15 ENCOUNTER — Other Ambulatory Visit: Payer: Self-pay | Admitting: Nurse Practitioner

## 2012-08-15 ENCOUNTER — Telehealth: Payer: Self-pay | Admitting: Neurology

## 2012-08-15 LAB — COMPREHENSIVE METABOLIC PANEL
ALT: 22 IU/L (ref 0–44)
Albumin/Globulin Ratio: 2.3 (ref 1.1–2.5)
Albumin: 4.5 g/dL (ref 3.5–5.5)
BUN: 13 mg/dL (ref 6–20)
Calcium: 9.3 mg/dL (ref 8.7–10.2)
Creatinine, Ser: 1.07 mg/dL (ref 0.76–1.27)
GFR calc Af Amer: 113 mL/min/{1.73_m2} (ref >59)
GFR calc non Af Amer: 98 mL/min/{1.73_m2} (ref >59)
Globulin, Total: 2 g/dL (ref 1.5–4.5)
Glucose: 93 mg/dL (ref 65–99)
Potassium: 4.2 mmol/L (ref 3.5–5.2)
Total Bilirubin: 0.2 mg/dL (ref 0.0–1.2)
Total Protein: 6.5 g/dL (ref 6.0–8.5)

## 2012-08-15 LAB — CBC WITH DIFFERENTIAL
Basos: 0 % (ref 0–3)
Eos: 3 % (ref 0–5)
HCT: 42.1 % (ref 37.5–51.0)
Hemoglobin: 14.9 g/dL (ref 12.6–17.7)
Immature Grans (Abs): 0 10*3/uL (ref 0.0–0.1)
Lymphs: 31 % (ref 14–46)
MCHC: 35.4 g/dL (ref 31.5–35.7)
Monocytes: 6 % (ref 4–12)
Neutrophils Absolute: 3.8 10*3/uL (ref 1.4–7.0)
Neutrophils Relative %: 60 % (ref 40–74)
RBC: 4.75 x10E6/uL (ref 4.14–5.80)

## 2012-08-15 MED ORDER — DIVALPROEX SODIUM ER 500 MG PO TB24: 1000.0000 mg | ORAL_TABLET | Freq: Every day | ORAL | Status: DC

## 2012-08-15 NOTE — Telephone Encounter (Signed)
Left message for patient that Depakote levels were low, and to increase Depakote 500mg  to 2 tabs at bedtime, per Eber Jones.

## 2012-09-02 ENCOUNTER — Telehealth: Payer: Self-pay | Admitting: *Deleted

## 2012-09-02 ENCOUNTER — Other Ambulatory Visit: Payer: Self-pay | Admitting: Nurse Practitioner

## 2012-09-02 DIAGNOSIS — G40909 Epilepsy, unspecified, not intractable, without status epilepticus: Secondary | ICD-10-CM

## 2012-09-02 NOTE — Telephone Encounter (Signed)
Pt here for lab draw.  Valproic Acid order placed per CMartin NP request.

## 2012-09-16 ENCOUNTER — Telehealth: Payer: Self-pay | Admitting: *Deleted

## 2012-09-17 NOTE — Telephone Encounter (Signed)
I called pt with valproic acid results 72.  Pt to continue on current dosage (2 tabs at bedtime).

## 2012-09-22 ENCOUNTER — Telehealth: Payer: Self-pay | Admitting: Neurology

## 2012-09-22 MED ORDER — DIVALPROEX SODIUM ER 500 MG PO TB24: 1000.0000 mg | ORAL_TABLET | Freq: Every day | ORAL | Status: DC

## 2012-09-22 NOTE — Telephone Encounter (Signed)
Updated Rx has been sent.  

## 2012-12-31 IMAGING — CT CT HEAD W/O CM
2 series · 16 of 30 positions shown, 20 images · non-contrast
Comparison: 10/02/2008

CLINICAL DATA: Seizures, fell.

CT HEAD WITHOUT CONTRAST
TECHNIQUE: Contiguous axial images were obtained from the base of
the skull through the vertex without contrast.

[Series 2: head w/o · axial · non-contrast · 0.43mm/px · z∈[-282,-162]mm · 13 of 29 slices shown, 17 images]
[im 3/29  brain]
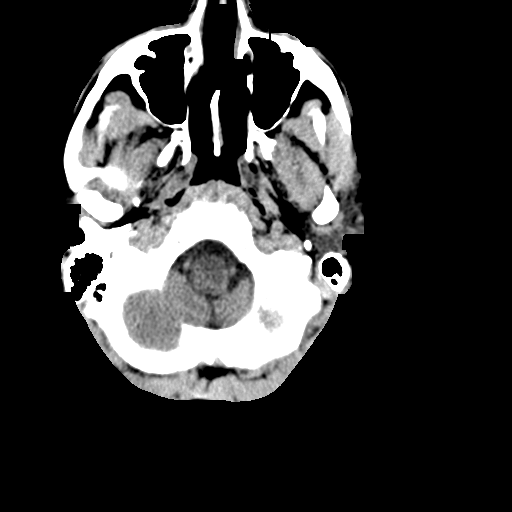
[im 3/29  bone]
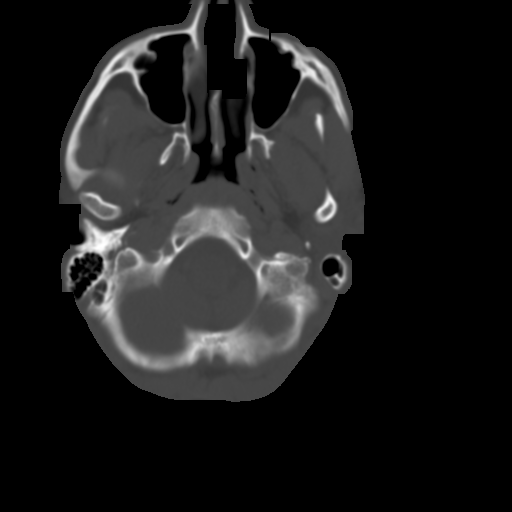
[im 5/29  brain]
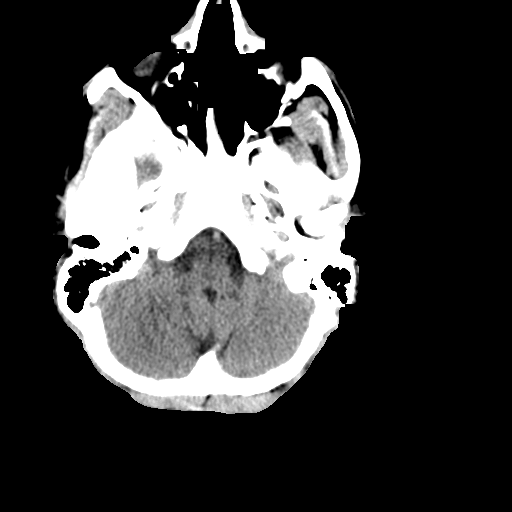
[im 7/29  brain]
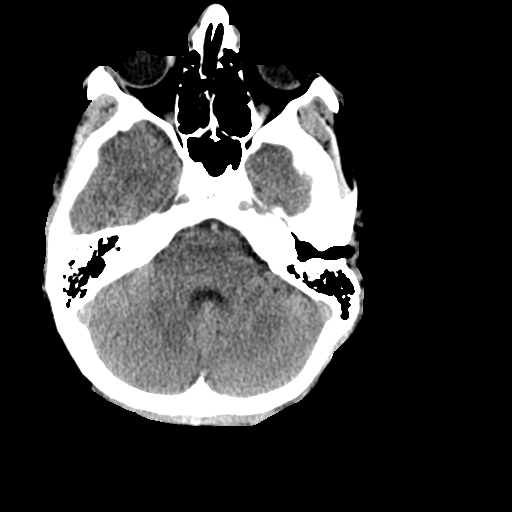
[im 9/29  brain]
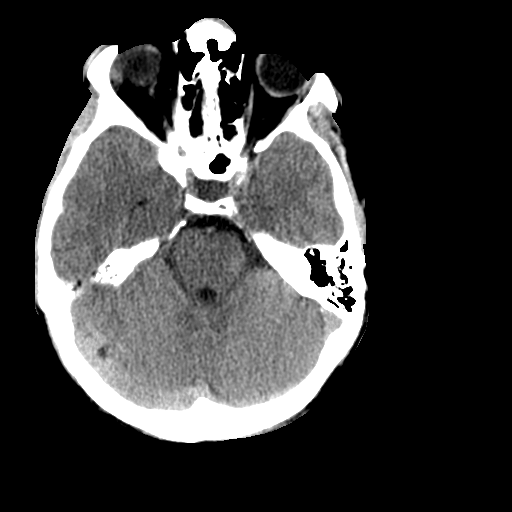
[im 11/29  brain]
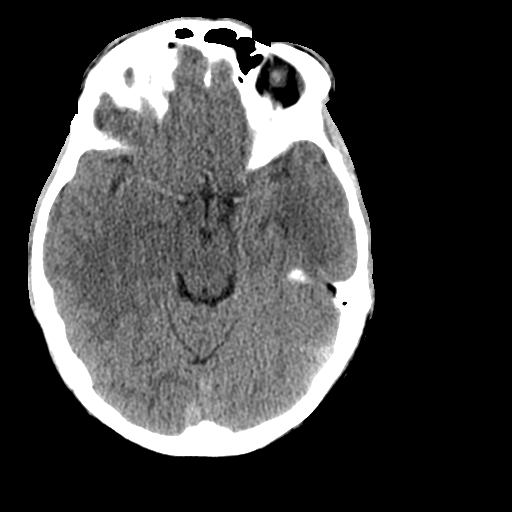
[im 11/29  bone]
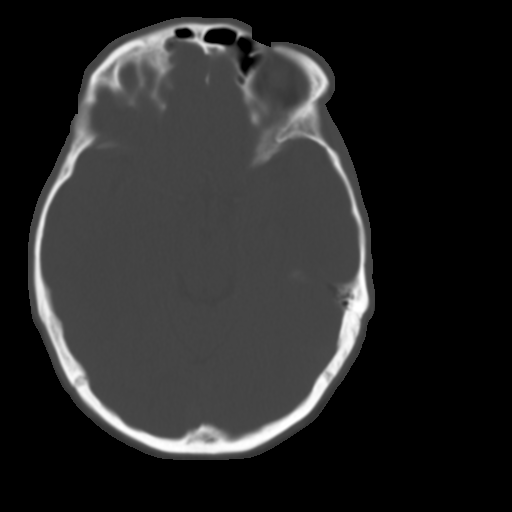
[im 13/29  brain]
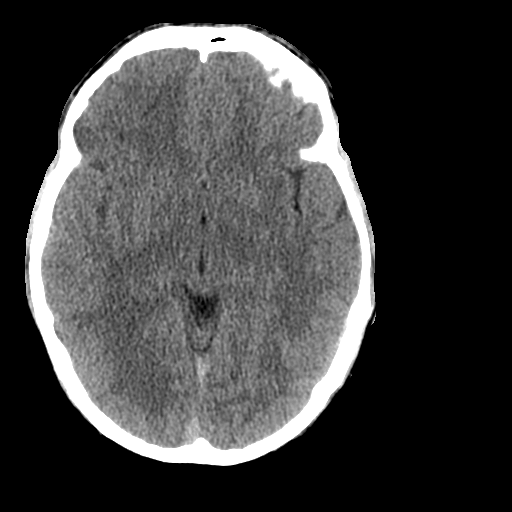
[im 15/29  brain]
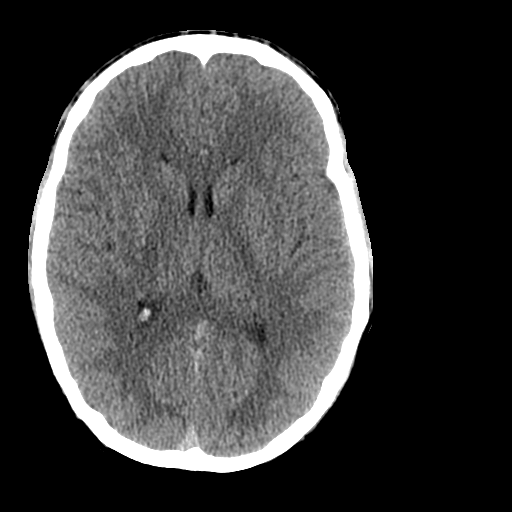
[im 17/29  brain]
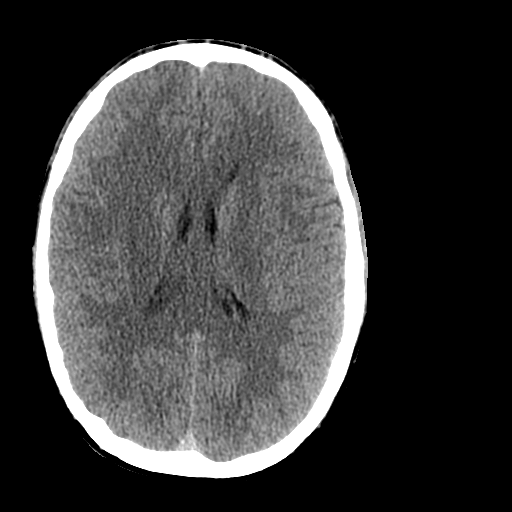
[im 19/29  brain]
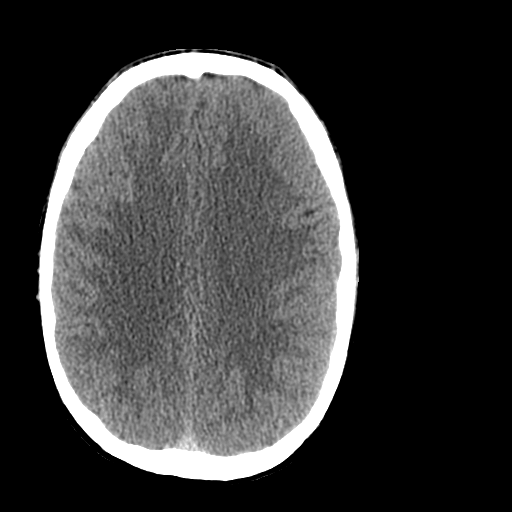
[im 19/29  bone]
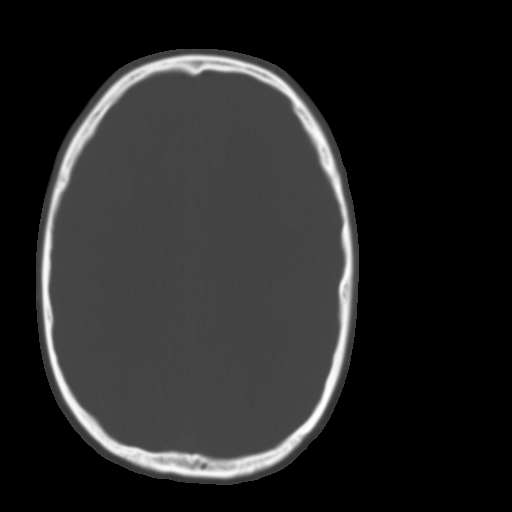
[im 21/29  brain]
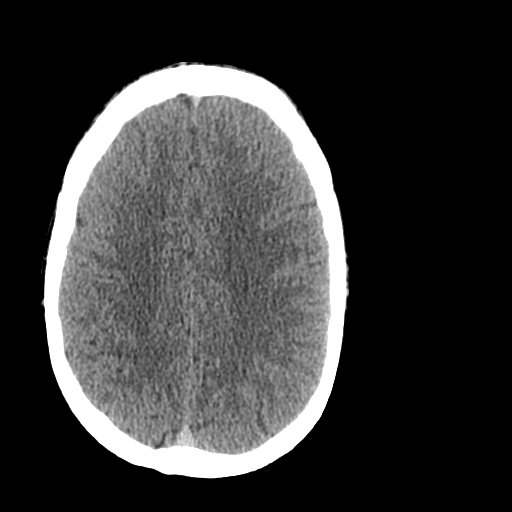
[im 23/29  brain]
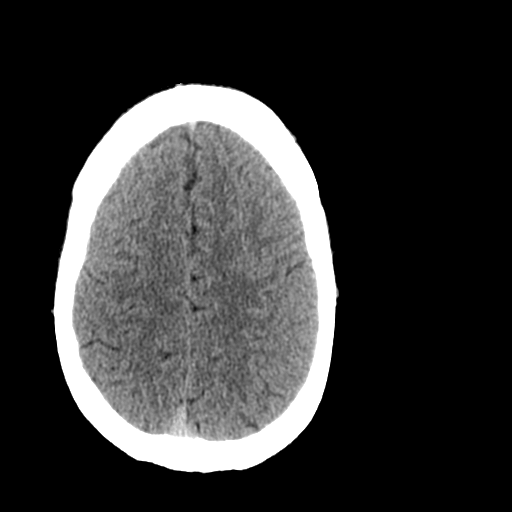
[im 25/29  brain]
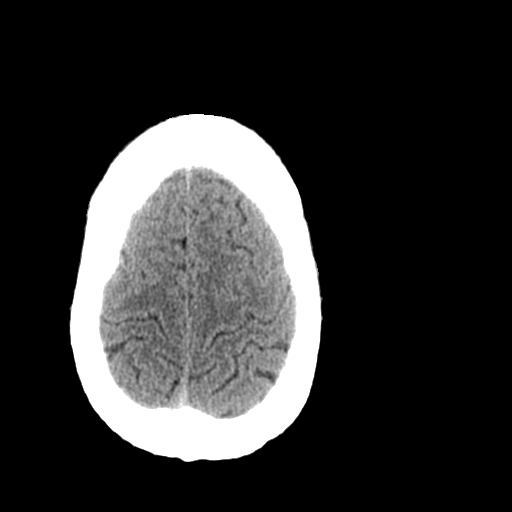
[im 27/29  brain]
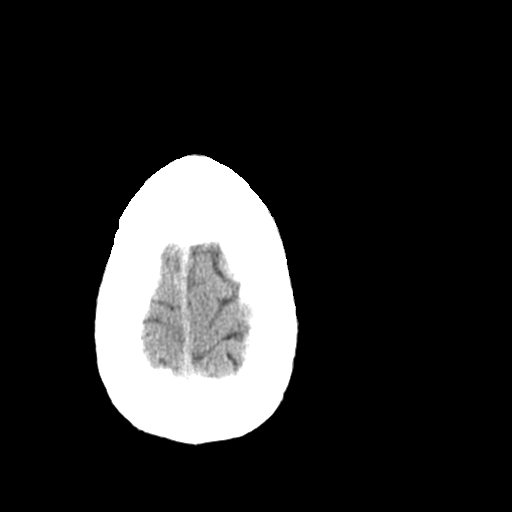
[im 27/29  bone]
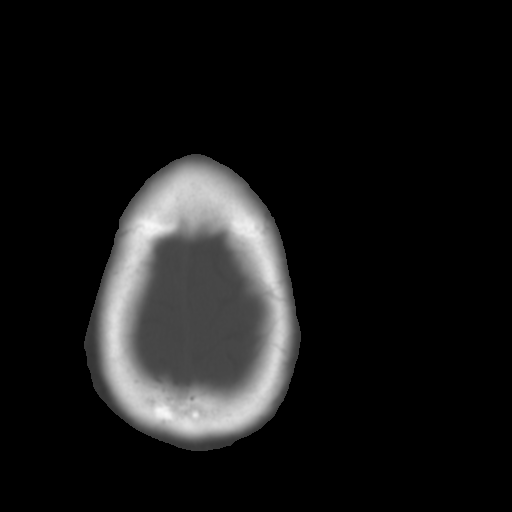

[Series 3: bone windows · axial · 0.43mm/px · z∈[-279,-239]mm · 3 of 29 slices shown]
[im 3/29  bone]
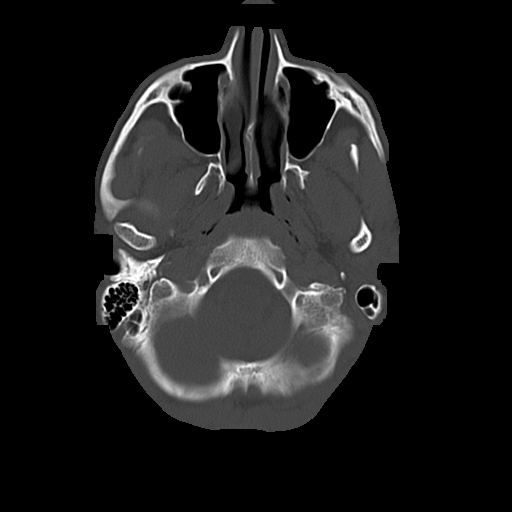
[im 7/29  bone]
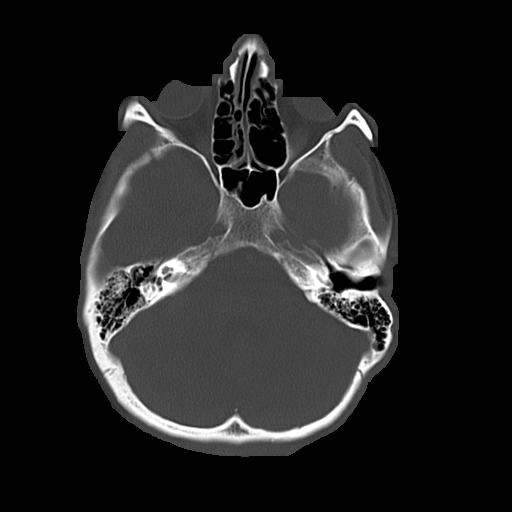
[im 11/29  bone]
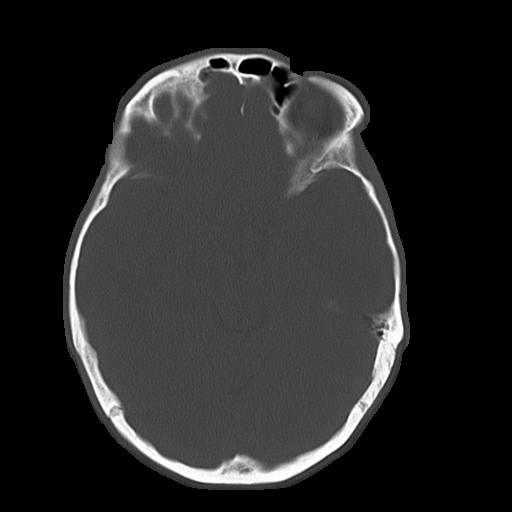

[16 of 30 positions shown; findings below may reference images not displayed]

FINDINGS: There is no evidence of acute intracranial hemorrhage,
brain edema, mass lesion, acute infarction,   mass effect, or
midline shift. Acute infarct may be inapparent on noncontrast CT.
No other intra-axial abnormalities are seen, and the ventricles and
sulci are within normal limits in size and symmetry.   No abnormal
extra-axial fluid collections or masses are identified.  No
significant calvarial abnormality.
IMPRESSION: 1. Negative for bleed or other acute intracranial process.

## 2013-04-17 NOTE — Telephone Encounter (Signed)
Called patient with results.  

## 2013-08-14 ENCOUNTER — Ambulatory Visit: Payer: BC Managed Care – PPO | Admitting: Nurse Practitioner

## 2013-08-31 ENCOUNTER — Encounter: Payer: Self-pay | Admitting: Nurse Practitioner

## 2013-08-31 ENCOUNTER — Ambulatory Visit (INDEPENDENT_AMBULATORY_CARE_PROVIDER_SITE_OTHER): Payer: BC Managed Care – PPO | Admitting: Nurse Practitioner

## 2013-08-31 ENCOUNTER — Encounter (INDEPENDENT_AMBULATORY_CARE_PROVIDER_SITE_OTHER): Payer: Self-pay

## 2013-08-31 VITALS — BP 131/63 | HR 55 | Ht 70.5 in | Wt 157.0 lb

## 2013-08-31 DIAGNOSIS — G40309 Generalized idiopathic epilepsy and epileptic syndromes, not intractable, without status epilepticus: Secondary | ICD-10-CM

## 2013-08-31 DIAGNOSIS — Z79899 Other long term (current) drug therapy: Secondary | ICD-10-CM

## 2013-08-31 MED ORDER — DIVALPROEX SODIUM ER 500 MG PO TB24: 1000.0000 mg | ORAL_TABLET | Freq: Every day | ORAL | Status: DC
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
## 2013-08-31 NOTE — Patient Instructions (Signed)
Pt to get labs today Continue Depakote will refill for 1 year F/U yearly

## 2013-09-01 LAB — COMPREHENSIVE METABOLIC PANEL
ALBUMIN: 4.6 g/dL (ref 3.5–5.5)
ALK PHOS: 56 IU/L (ref 39–117)
ALT: 13 IU/L (ref 0–44)
AST: 13 IU/L (ref 0–40)
Albumin/Globulin Ratio: 2.3 (ref 1.1–2.5)
BUN / CREAT RATIO: 13 (ref 8–19)
BUN: 13 mg/dL (ref 6–20)
CHLORIDE: 102 mmol/L (ref 97–108)
CO2: 21 mmol/L (ref 18–29)
Calcium: 9.2 mg/dL (ref 8.7–10.2)
Creatinine, Ser: 0.97 mg/dL (ref 0.76–1.27)
GFR calc Af Amer: 127 mL/min/{1.73_m2} (ref >59)
GFR calc non Af Amer: 110 mL/min/{1.73_m2} (ref >59)
Globulin, Total: 2 g/dL (ref 1.5–4.5)
Glucose: 89 mg/dL (ref 65–99)
POTASSIUM: 4.5 mmol/L (ref 3.5–5.2)
Sodium: 140 mmol/L (ref 134–144)
Total Bilirubin: 0.3 mg/dL (ref 0.0–1.2)
Total Protein: 6.6 g/dL (ref 6.0–8.5)

## 2013-09-01 LAB — CBC WITH DIFFERENTIAL/PLATELET
BASOS ABS: 0 10*3/uL (ref 0.0–0.2)
Basos: 0 %
EOS ABS: 0.2 10*3/uL (ref 0.0–0.4)
Eos: 3 %
HEMATOCRIT: 43.1 % (ref 37.5–51.0)
Hemoglobin: 15.1 g/dL (ref 12.6–17.7)
IMMATURE GRANULOCYTES: 0 %
Immature Grans (Abs): 0 10*3/uL (ref 0.0–0.1)
LYMPHS ABS: 2.3 10*3/uL (ref 0.7–3.1)
LYMPHS: 30 %
MCH: 31.9 pg (ref 26.6–33.0)
MCHC: 35 g/dL (ref 31.5–35.7)
MCV: 91 fL (ref 79–97)
MONOCYTES: 6 %
Monocytes Absolute: 0.5 10*3/uL (ref 0.1–0.9)
Neutrophils Absolute: 4.7 10*3/uL (ref 1.4–7.0)
Neutrophils Relative %: 61 %
RBC: 4.74 x10E6/uL (ref 4.14–5.80)
RDW: 13.1 % (ref 12.3–15.4)
WBC: 7.6 10*3/uL (ref 3.4–10.8)

## 2013-09-02 NOTE — Progress Notes (Signed)
Quick Note:  I called and gave message that labs looked good to grandmother. Pt to call back if questions. (2nd call). ______

## 2013-09-09 ENCOUNTER — Telehealth: Payer: Self-pay | Admitting: Nurse Practitioner

## 2013-09-09 NOTE — Telephone Encounter (Signed)
Patient's grandmother called stating the patient had a seizure on yesterday at 5:00 at work. Patient went to work today with headache and feeling bad. The grandmother thinks he probably took pain killers which may have trigger the seizure because he was upset because his father had taken his brother side during an argument. Patient's grandmother would like to know if any changes need to made to Depakote ER 500 mg.

## 2013-09-10 NOTE — Telephone Encounter (Signed)
Called and spoke with grandmother and she said that patient was at work when he had the seizure, did not go to ER and she fells like he may have taken some type of pain medicine and not taking his seizure medicine, and also had gotten into an argument with father. All of this may have brought the seizure on. She went and picked up patient after seizure and he complained of hurting all over, took his seizure medicine (Depakote ER 500mg ), laid down. According to the grandmother patient today is fine and is back at work.  Could not reach patient

## 2013-09-10 NOTE — Telephone Encounter (Signed)
noted 

## 2013-09-25 ENCOUNTER — Telehealth: Payer: Self-pay | Admitting: Neurology

## 2013-09-25 NOTE — Telephone Encounter (Signed)
Patient's grandmother calling to state that patient's seizures are getting worse, states that he had 2 seizures 9 days apart and that his boss is worried about him being at work, grandmother wants to schedule an appointment as soon as possible. Please return call and advise.

## 2013-09-29 ENCOUNTER — Encounter: Payer: Self-pay | Admitting: Nurse Practitioner

## 2013-09-29 ENCOUNTER — Ambulatory Visit (INDEPENDENT_AMBULATORY_CARE_PROVIDER_SITE_OTHER): Payer: BC Managed Care – PPO | Admitting: Nurse Practitioner

## 2013-09-29 VITALS — BP 145/88 | HR 63 | Ht 70.5 in | Wt 153.8 lb

## 2013-09-29 DIAGNOSIS — G40309 Generalized idiopathic epilepsy and epileptic syndromes, not intractable, without status epilepticus: Secondary | ICD-10-CM

## 2013-09-29 DIAGNOSIS — Z79899 Other long term (current) drug therapy: Secondary | ICD-10-CM

## 2013-09-29 NOTE — Telephone Encounter (Signed)
Called patients grandmother back and left two messages to return my call. Stated that I was out of the office on Monday. Patient saw Eber JonesCarolyn 08-31-2013. Eber JonesCarolyn Please advise me what you would like to do when call Is returned.

## 2013-09-29 NOTE — Patient Instructions (Signed)
Will check VPA level, Continue Depakote at current dose Will await labs to change dose if needed F/U 3 months

## 2013-09-29 NOTE — Telephone Encounter (Signed)
Patient grandmother called back and stated that his boss want let him come back to work unless he see's the doctor. Patient had to seizure nine days apart. Patient has been out of work since last Wednesday. Patient 's grandmother states he has not missed any of  Depakote Er 500mg .  Eber Jonesarolyn Please advise me If you would like me to fill your open slot today you have a opening at 2:30.

## 2013-09-29 NOTE — Telephone Encounter (Signed)
Spoke with Eber Jonesarolyn and patient is on Nelsonarolyn schedule to come in today to see her. Spoke to patient's grandmother she will have him here today and she was instructed to bring his medication.
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
## 2013-09-29 NOTE — Telephone Encounter (Signed)
Please give patient appt this afternoon

## 2013-09-30 ENCOUNTER — Telehealth: Payer: Self-pay | Admitting: Nurse Practitioner

## 2013-09-30 LAB — VALPROIC ACID LEVEL: VALPROIC ACID LVL: 54 ug/mL (ref 50–100)

## 2013-09-30 MED ORDER — DIVALPROEX SODIUM ER 500 MG PO TB24: 1500.0000 mg | ORAL_TABLET | Freq: Every day | ORAL | Status: DC

## 2013-09-30 NOTE — Telephone Encounter (Signed)
Spoke to patient and relayed that he is to increase his Depakote to 3 tablets at bedtime, per Eber Jonesarolyn.  Patient vebalized understanding.

## 2013-09-30 NOTE — Telephone Encounter (Signed)
Please ask patient to increase Depakote to 3 tabs hs. Faxed to pharmacy.

## 2013-12-14 ENCOUNTER — Telehealth: Payer: Self-pay | Admitting: *Deleted

## 2013-12-14 NOTE — Telephone Encounter (Signed)
Left patient a message that he needs to call the office and reschedule his appt due to CM admin time.

## 2013-12-31 ENCOUNTER — Other Ambulatory Visit: Payer: Self-pay | Admitting: *Deleted

## 2013-12-31 ENCOUNTER — Ambulatory Visit: Payer: BC Managed Care – PPO | Admitting: Nurse Practitioner

## 2013-12-31 ENCOUNTER — Ambulatory Visit: Payer: BC Managed Care – PPO | Admitting: Adult Health

## 2013-12-31 ENCOUNTER — Other Ambulatory Visit (INDEPENDENT_AMBULATORY_CARE_PROVIDER_SITE_OTHER): Payer: Self-pay

## 2013-12-31 DIAGNOSIS — Z5181 Encounter for therapeutic drug level monitoring: Secondary | ICD-10-CM

## 2013-12-31 DIAGNOSIS — Z0289 Encounter for other administrative examinations: Secondary | ICD-10-CM

## 2014-01-01 LAB — VALPROIC ACID LEVEL: VALPROIC ACID LVL: 24 ug/mL — AB (ref 50–100)

## 2014-01-04 ENCOUNTER — Telehealth: Payer: Self-pay | Admitting: Nurse Practitioner

## 2014-01-04 ENCOUNTER — Encounter: Payer: Self-pay | Admitting: Nurse Practitioner

## 2014-01-04 DIAGNOSIS — Z5181 Encounter for therapeutic drug level monitoring: Secondary | ICD-10-CM

## 2014-01-04 DIAGNOSIS — G40909 Epilepsy, unspecified, not intractable, without status epilepticus: Secondary | ICD-10-CM

## 2014-01-04 MED ORDER — DIVALPROEX SODIUM ER 500 MG PO TB24: 2000.0000 mg | ORAL_TABLET | Freq: Every day | ORAL | Status: DC

## 2014-01-04 NOTE — Telephone Encounter (Signed)
VPA level low dose change, RX renewed

## 2014-01-05 ENCOUNTER — Encounter: Payer: Self-pay | Admitting: *Deleted

## 2014-09-01 ENCOUNTER — Telehealth: Payer: Self-pay | Admitting: *Deleted

## 2014-09-01 ENCOUNTER — Ambulatory Visit: Payer: BC Managed Care – PPO | Admitting: Nurse Practitioner

## 2014-09-01 NOTE — Telephone Encounter (Signed)
I called and spoke to pt and he is out of town today.  Pt to call back when in town.

## 2016-08-31 ENCOUNTER — Emergency Department (HOSPITAL_COMMUNITY): Payer: Self-pay

## 2016-08-31 ENCOUNTER — Encounter (HOSPITAL_COMMUNITY): Payer: Self-pay | Admitting: Emergency Medicine

## 2016-08-31 ENCOUNTER — Inpatient Hospital Stay (HOSPITAL_COMMUNITY)
Admission: EM | Admit: 2016-08-31 | Discharge: 2016-09-01 | DRG: 100 | Discharge status: Against Medical Advice | Payer: Self-pay | Attending: Pulmonary Disease | Admitting: Pulmonary Disease

## 2016-08-31 DIAGNOSIS — F1210 Cannabis abuse, uncomplicated: Secondary | ICD-10-CM | POA: Diagnosis present

## 2016-08-31 DIAGNOSIS — R4182 Altered mental status, unspecified: Secondary | ICD-10-CM

## 2016-08-31 DIAGNOSIS — E872 Acidosis: Secondary | ICD-10-CM | POA: Diagnosis present

## 2016-08-31 DIAGNOSIS — F17210 Nicotine dependence, cigarettes, uncomplicated: Secondary | ICD-10-CM | POA: Diagnosis present

## 2016-08-31 DIAGNOSIS — J9600 Acute respiratory failure, unspecified whether with hypoxia or hypercapnia: Secondary | ICD-10-CM | POA: Diagnosis present

## 2016-08-31 DIAGNOSIS — R569 Unspecified convulsions: Secondary | ICD-10-CM

## 2016-08-31 DIAGNOSIS — Z9114 Patient's other noncompliance with medication regimen: Secondary | ICD-10-CM

## 2016-08-31 DIAGNOSIS — F1110 Opioid abuse, uncomplicated: Secondary | ICD-10-CM | POA: Diagnosis present

## 2016-08-31 DIAGNOSIS — Z79899 Other long term (current) drug therapy: Secondary | ICD-10-CM

## 2016-08-31 DIAGNOSIS — G40909 Epilepsy, unspecified, not intractable, without status epilepticus: Principal | ICD-10-CM | POA: Diagnosis present

## 2016-08-31 DIAGNOSIS — F1610 Hallucinogen abuse, uncomplicated: Secondary | ICD-10-CM | POA: Diagnosis present

## 2016-08-31 DIAGNOSIS — J45909 Unspecified asthma, uncomplicated: Secondary | ICD-10-CM | POA: Diagnosis present

## 2016-08-31 DIAGNOSIS — Z9119 Patient's noncompliance with other medical treatment and regimen: Secondary | ICD-10-CM

## 2016-08-31 DIAGNOSIS — Z781 Physical restraint status: Secondary | ICD-10-CM

## 2016-08-31 DIAGNOSIS — N179 Acute kidney failure, unspecified: Secondary | ICD-10-CM | POA: Diagnosis present

## 2016-08-31 DIAGNOSIS — Z5321 Procedure and treatment not carried out due to patient leaving prior to being seen by health care provider: Secondary | ICD-10-CM | POA: Diagnosis not present

## 2016-08-31 LAB — CBC WITH DIFFERENTIAL/PLATELET
BASOS PCT: 0 %
Basophils Absolute: 0 10*3/uL (ref 0.0–0.1)
EOS ABS: 0.3 10*3/uL (ref 0.0–0.7)
EOS PCT: 2 %
HCT: 47.7 % (ref 39.0–52.0)
Hemoglobin: 16.5 g/dL (ref 13.0–17.0)
Lymphocytes Relative: 47 %
Lymphs Abs: 7 10*3/uL — ABNORMAL HIGH (ref 0.7–4.0)
MCH: 32.4 pg (ref 26.0–34.0)
MCHC: 34.6 g/dL (ref 30.0–36.0)
MCV: 93.5 fL (ref 78.0–100.0)
MONO ABS: 1.1 10*3/uL — AB (ref 0.1–1.0)
Monocytes Relative: 7 %
NEUTROS ABS: 6.6 10*3/uL (ref 1.7–7.7)
Neutrophils Relative %: 44 %
PLATELETS: 393 10*3/uL (ref 150–400)
RBC: 5.1 MIL/uL (ref 4.22–5.81)
RDW: 12.9 % (ref 11.5–15.5)
WBC: 15 10*3/uL — ABNORMAL HIGH (ref 4.0–10.5)

## 2016-08-31 LAB — I-STAT ARTERIAL BLOOD GAS, ED
Acid-base deficit: 4 mmol/L — ABNORMAL HIGH (ref 0.0–2.0)
Bicarbonate: 21.6 mmol/L (ref 20.0–28.0)
O2 SAT: 95 %
PCO2 ART: 42.5 mmHg (ref 32.0–48.0)
PH ART: 7.315 — AB (ref 7.350–7.450)
PO2 ART: 85 mmHg (ref 83.0–108.0)
Patient temperature: 98.6
TCO2: 23 mmol/L (ref 0–100)

## 2016-08-31 LAB — COMPREHENSIVE METABOLIC PANEL
ALBUMIN: 4.5 g/dL (ref 3.5–5.0)
ALK PHOS: 76 U/L (ref 38–126)
ALT: 48 U/L (ref 17–63)
AST: 40 U/L (ref 15–41)
Anion gap: 24 — ABNORMAL HIGH (ref 5–15)
BUN: 17 mg/dL (ref 6–20)
CALCIUM: 9.2 mg/dL (ref 8.9–10.3)
CHLORIDE: 101 mmol/L (ref 101–111)
CO2: 15 mmol/L — AB (ref 22–32)
CREATININE: 1.19 mg/dL (ref 0.61–1.24)
GFR calc Af Amer: >60 mL/min (ref >60)
GFR calc non Af Amer: >60 mL/min (ref >60)
GLUCOSE: 82 mg/dL (ref 65–99)
Potassium: 4.3 mmol/L (ref 3.5–5.1)
SODIUM: 140 mmol/L (ref 135–145)
Total Bilirubin: 0.7 mg/dL (ref 0.3–1.2)
Total Protein: 7.4 g/dL (ref 6.5–8.1)

## 2016-08-31 LAB — RAPID URINE DRUG SCREEN, HOSP PERFORMED
AMPHETAMINES: NOT DETECTED
BARBITURATES: NOT DETECTED
BENZODIAZEPINES: POSITIVE — AB
Cocaine: NOT DETECTED
Opiates: POSITIVE — AB
TETRAHYDROCANNABINOL: POSITIVE — AB

## 2016-08-31 LAB — URINALYSIS, COMPLETE (UACMP) WITH MICROSCOPIC
BILIRUBIN URINE: NEGATIVE
Glucose, UA: NEGATIVE mg/dL
Ketones, ur: NEGATIVE mg/dL
Leukocytes, UA: NEGATIVE
Nitrite: NEGATIVE
PROTEIN: 100 mg/dL — AB
SPECIFIC GRAVITY, URINE: 1.016 (ref 1.005–1.030)
pH: 5 (ref 5.0–8.0)

## 2016-08-31 LAB — CK: Total CK: 222 U/L (ref 49–397)

## 2016-08-31 LAB — CBG MONITORING, ED: Glucose-Capillary: 79 mg/dL (ref 65–99)

## 2016-08-31 LAB — AMMONIA: AMMONIA: 140 umol/L — AB (ref 9–35)

## 2016-08-31 LAB — I-STAT CG4 LACTIC ACID, ED: LACTIC ACID, VENOUS: 13.56 mmol/L — AB (ref 0.5–1.9)

## 2016-08-31 LAB — TRIGLYCERIDES: TRIGLYCERIDES: 152 mg/dL — AB (ref <150)

## 2016-08-31 LAB — ETHANOL

## 2016-08-31 MED ORDER — SODIUM CHLORIDE 0.9 % IV BOLUS (SEPSIS)
250.0000 mL | Freq: Once | INTRAVENOUS | Status: AC
  Administered 2016-08-31: 250 mL via INTRAVENOUS

## 2016-08-31 MED ORDER — FENTANYL 2500MCG IN NS 250ML (10MCG/ML) PREMIX INFUSION
25.0000 ug/h | INTRAVENOUS | Status: DC
  Administered 2016-08-31: 40 ug/h via INTRAVENOUS
  Filled 2016-08-31: qty 250

## 2016-08-31 MED ORDER — SODIUM CHLORIDE 0.9 % IV SOLN
1000.0000 mg | Freq: Once | INTRAVENOUS | Status: AC
  Administered 2016-08-31: 1000 mg via INTRAVENOUS
  Filled 2016-08-31: qty 10

## 2016-08-31 MED ORDER — ACETAMINOPHEN 325 MG PO TABS: 650.0000 mg | ORAL_TABLET | ORAL | Status: DC | PRN

## 2016-08-31 MED ORDER — PROPOFOL 1000 MG/100ML IV EMUL
5.0000 ug/kg/min | INTRAVENOUS | Status: DC
  Administered 2016-08-31: 80 ug/kg/min via INTRAVENOUS
  Administered 2016-08-31: 30 ug/kg/min via INTRAVENOUS
  Filled 2016-08-31: qty 100

## 2016-08-31 MED ORDER — SODIUM CHLORIDE 0.9 % IV SOLN
250.0000 mL | INTRAVENOUS | Status: DC | PRN
Start: 2016-08-31 — End: 2016-09-01

## 2016-08-31 MED ORDER — LORAZEPAM 2 MG/ML IJ SOLN
INTRAMUSCULAR | Status: AC
  Administered 2016-08-31: 2 mg
  Filled 2016-08-31: qty 2

## 2016-08-31 MED ORDER — VANCOMYCIN HCL IN DEXTROSE 1-5 GM/200ML-% IV SOLN
1000.0000 mg | Freq: Once | INTRAVENOUS | Status: AC
Start: 2016-08-31 — End: 2016-08-31
  Administered 2016-08-31: 1000 mg via INTRAVENOUS
  Filled 2016-08-31: qty 200

## 2016-08-31 MED ORDER — SODIUM CHLORIDE 0.9 % IV BOLUS (SEPSIS)
1000.0000 mL | Freq: Once | INTRAVENOUS | Status: AC
  Administered 2016-08-31: 1000 mL via INTRAVENOUS

## 2016-08-31 MED ORDER — ZIPRASIDONE MESYLATE 20 MG IM SOLR: 20.0000 mg | Freq: Once | INTRAMUSCULAR | Status: DC

## 2016-08-31 MED ORDER — ORAL CARE MOUTH RINSE
15.0000 mL | Freq: Four times a day (QID) | OROMUCOSAL | Status: DC
  Administered 2016-09-01: 15 mL via OROMUCOSAL

## 2016-08-31 MED ORDER — ALBUTEROL SULFATE (2.5 MG/3ML) 0.083% IN NEBU: 2.5000 mg | INHALATION_SOLUTION | RESPIRATORY_TRACT | Status: DC | PRN

## 2016-08-31 MED ORDER — PROPOFOL 1000 MG/100ML IV EMUL
INTRAVENOUS | Status: AC
  Administered 2016-08-31: 30 ug/kg/min via INTRAVENOUS
  Filled 2016-08-31: qty 100

## 2016-08-31 MED ORDER — SODIUM CHLORIDE 0.9 % IV SOLN: INTRAVENOUS | Status: DC

## 2016-08-31 MED ORDER — MIDAZOLAM HCL 2 MG/2ML IJ SOLN: 2.0000 mg | INTRAMUSCULAR | Status: DC | PRN

## 2016-08-31 MED ORDER — CHLORHEXIDINE GLUCONATE 0.12% ORAL RINSE (MEDLINE KIT)
15.0000 mL | Freq: Two times a day (BID) | OROMUCOSAL | Status: DC
  Administered 2016-09-01 (×2): 15 mL via OROMUCOSAL

## 2016-08-31 MED ORDER — SODIUM CHLORIDE 0.9 % IV SOLN
INTRAVENOUS | Status: DC
  Administered 2016-08-31: 22:00:00 via INTRAVENOUS

## 2016-08-31 MED ORDER — PROPOFOL 1000 MG/100ML IV EMUL
0.0000 ug/kg/min | INTRAVENOUS | Status: DC
  Administered 2016-08-31: 90 ug/kg/min via INTRAVENOUS
  Filled 2016-08-31: qty 100

## 2016-08-31 MED ORDER — PIPERACILLIN-TAZOBACTAM 3.375 G IVPB 30 MIN
3.3750 g | Freq: Once | INTRAVENOUS | Status: AC
  Administered 2016-08-31: 3.375 g via INTRAVENOUS
  Filled 2016-08-31: qty 50

## 2016-08-31 MED ORDER — ONDANSETRON HCL 4 MG/2ML IJ SOLN: 4.0000 mg | Freq: Four times a day (QID) | INTRAMUSCULAR | Status: DC | PRN

## 2016-08-31 MED ORDER — PROPOFOL 1000 MG/100ML IV EMUL
INTRAVENOUS | Status: AC
  Administered 2016-08-31: 80 ug/kg/min via INTRAVENOUS
  Filled 2016-08-31: qty 100

## 2016-08-31 MED ORDER — FAMOTIDINE IN NACL 20-0.9 MG/50ML-% IV SOLN
20.0000 mg | Freq: Two times a day (BID) | INTRAVENOUS | Status: DC
  Administered 2016-09-01 (×2): 20 mg via INTRAVENOUS
  Filled 2016-08-31 (×3): qty 50

## 2016-08-31 MED ORDER — DOCUSATE SODIUM 50 MG/5ML PO LIQD
100.0000 mg | Freq: Two times a day (BID) | ORAL | Status: DC | PRN
  Filled 2016-08-31: qty 10

## 2016-08-31 MED ORDER — FENTANYL CITRATE (PF) 100 MCG/2ML IJ SOLN: 50.0000 ug | Freq: Once | INTRAMUSCULAR | Status: DC

## 2016-08-31 MED ORDER — FENTANYL BOLUS VIA INFUSION
50.0000 ug | INTRAVENOUS | Status: DC | PRN
  Filled 2016-08-31: qty 50

## 2016-08-31 MED ORDER — LORAZEPAM 2 MG/ML IJ SOLN
INTRAMUSCULAR | Status: AC
  Administered 2016-08-31: 2 mg
  Filled 2016-08-31: qty 3

## 2016-08-31 MED ORDER — ASPIRIN 81 MG PO CHEW: 324.0000 mg | CHEWABLE_TABLET | ORAL | Status: DC

## 2016-08-31 MED ORDER — HEPARIN SODIUM (PORCINE) 5000 UNIT/ML IJ SOLN
5000.0000 [IU] | Freq: Three times a day (TID) | INTRAMUSCULAR | Status: DC
  Administered 2016-09-01 (×2): 5000 [IU] via SUBCUTANEOUS
  Filled 2016-08-31 (×3): qty 1

## 2016-08-31 MED ORDER — LORAZEPAM 2 MG/ML IJ SOLN
INTRAMUSCULAR | Status: AC
  Administered 2016-08-31: 2 mg
  Filled 2016-08-31: qty 1

## 2016-08-31 MED ORDER — ASPIRIN 300 MG RE SUPP: 300.0000 mg | RECTAL | Status: DC

## 2016-08-31 NOTE — ED Provider Notes (Signed)
[facility]-EMERGENCY DEPT Provider Note   CSN: [id number] Arrival date & time: 08/31/16  1930     History   Chief Complaint Chief Complaint  Patient presents with  . Seizures   Level 5 caveat altered mental status HPI Jared Gay is a 26 y.o. male.  HPI  26 y.o. Male with history of seizure disorder presents today with altered mental status.  Patient was working in the yard and is reported that he was normal prior to this. He was found having a seizure. EMS gave him Ativan. He is  Past Medical History:  Diagnosis Date  . Asthma   . Gastritis   . Seizures [facility](HCC)     Patient Active Problem List   Diagnosis Date Noted  . Generalized convulsive epilepsy without mention of intractable epilepsy 08/14/2012  . Transient alteration of awareness 08/14/2012  . Encounter for therapeutic drug monitoring 08/14/2012  . Acute gastritis 01/13/2011  . Dehydration 01/13/2011  . Electrolyte abnormality 01/13/2011  . Seizure disorder (HCC) 01/13/2011  . Active smoker 01/13/2011    Past Surgical History:  Procedure Laterality Date  . TONSILLECTOMY         Home Medications    Prior to Admission medications   Medication Sig Start Date End Date Taking? Authorizing Provider  divalproex (DEPAKOTE ER) 500 MG 24 hr tablet Take 4 tablets (2,000 mg total) by mouth at bedtime. Patient not taking: Reported on 08/31/2016 01/04/14   Nilda RiggsMartin, Nancy Carolyn, NP    Family History Family History  Problem Relation Age of Onset  . Colon cancer Neg Hx     Social History Social History  Substance Use Topics  . Smoking status: Current Every Day Smoker    Packs/day: 0.50    Types: Cigarettes  . Smokeless tobacco: Never Used  . Alcohol use 0.0 oz/week    1 - 2 Cans of beer per week     Comment: weekends only if that      Allergies   Patient has no known allergies.   Review of Systems Review of Systems   Physical Exam Updated Vital Signs BP (!) 102/55   Pulse 73   Temp 100 F (37.8  C)   Resp 17   Ht 1.829 m (6')   Wt 70 kg (154 lb 5.2 oz)   SpO2 100%   BMI 20.93 kg/m   Physical Exam   ED Treatments / Results  Labs (all labs ordered are listed, but only abnormal results are displayed) Labs Reviewed  TRIGLYCERIDES - Abnormal; Notable for the following:       Result Value   Triglycerides 152 (*)    All other components within normal limits  COMPREHENSIVE METABOLIC PANEL - Abnormal; Notable for the following:    CO2 15 (*)    Anion gap 24 (*)    All other components within normal limits  CBC WITH DIFFERENTIAL/PLATELET - Abnormal; Notable for the following:    WBC 15.0 (*)    Lymphs Abs 7.0 (*)    Monocytes Absolute 1.1 (*)    All other components within normal limits  URINALYSIS, COMPLETE (UACMP) WITH MICROSCOPIC - Abnormal; Notable for the following:    APPearance HAZY (*)    Hgb urine dipstick MODERATE (*)    Protein, ur 100 (*)    Bacteria, UA FEW (*)    Squamous Epithelial / LPF 0-5 (*)    All other components within normal limits  AMMONIA - Abnormal; Notable for the following:    Ammonia  140 (*)    All other components within normal limits  RAPID URINE DRUG SCREEN, HOSP PERFORMED - Abnormal; Notable for the following:    Opiates POSITIVE (*)    Benzodiazepines POSITIVE (*)    Tetrahydrocannabinol POSITIVE (*)    All other components within normal limits  I-STAT CG4 LACTIC ACID, ED - Abnormal; Notable for the following:    Lactic Acid, Venous 13.56 (*)    All other components within normal limits  I-STAT ARTERIAL BLOOD GAS, ED - Abnormal; Notable for the following:    pH, Arterial 7.315 (*)    Acid-base deficit 4.0 (*)    All other components within normal limits  CULTURE, BLOOD (ROUTINE X 2)  CULTURE, BLOOD (ROUTINE X 2)  CK  ETHANOL  CBG MONITORING, ED  I-STAT ARTERIAL BLOOD GAS, ED    EKG  EKG Interpretation None       Radiology Ct Head Wo Contrast  Result Date: 08/31/2016 CLINICAL DATA:  26 y/o  M; witnessed seizure.  EXAM: CT HEAD WITHOUT CONTRAST CT CERVICAL SPINE WITHOUT CONTRAST TECHNIQUE: Multidetector CT imaging of the head and cervical spine was performed following the standard protocol without intravenous contrast. Multiplanar CT image reconstructions of the cervical spine were also generated. COMPARISON:  10/08/2011 CT head FINDINGS: CT HEAD FINDINGS Brain: No evidence of acute infarction, hemorrhage, hydrocephalus, extra-axial collection or mass lesion/mass effect. Vascular: No hyperdense vessel or unexpected calcification. Skull: Left parietal scalp soft tissue thickening compatible with contusion. No displaced calvarial fracture. Sinuses/Orbits: No acute finding. Other: None. CT CERVICAL SPINE FINDINGS Alignment: Straightening of cervical lordosis with mild reversal of curvature at C5-6. No listhesis. Skull base and vertebrae: No acute fracture. No primary bone lesion or focal pathologic process. Soft tissues and spinal canal: No prevertebral fluid or swelling. No visible canal hematoma. Disc levels:  No significant cervical degenerative changes. Upper chest: Negative. Other: Endotracheal tube and enteric tubes extend below the field of view. IMPRESSION: CT head: 1. Left parietal scalp soft tissue thickening compatible with contusion. No displaced calvarial fracture. 2. No acute intracranial abnormality. 3. Otherwise unremarkable CT of the head. CT cervical spine: No acute fracture or dislocation. Unremarkable CT of cervical spine. Electronically Signed   By: Mitzi Hansen M.D.   On: 08/31/2016 21:37   Ct Cervical Spine Wo Contrast  Result Date: 08/31/2016 CLINICAL DATA:  26 y/o  M; witnessed seizure. EXAM: CT HEAD WITHOUT CONTRAST CT CERVICAL SPINE WITHOUT CONTRAST TECHNIQUE: Multidetector CT imaging of the head and cervical spine was performed following the standard protocol without intravenous contrast. Multiplanar CT image reconstructions of the cervical spine were also generated. COMPARISON:   10/08/2011 CT head FINDINGS: CT HEAD FINDINGS Brain: No evidence of acute infarction, hemorrhage, hydrocephalus, extra-axial collection or mass lesion/mass effect. Vascular: No hyperdense vessel or unexpected calcification. Skull: Left parietal scalp soft tissue thickening compatible with contusion. No displaced calvarial fracture. Sinuses/Orbits: No acute finding. Other: None. CT CERVICAL SPINE FINDINGS Alignment: Straightening of cervical lordosis with mild reversal of curvature at C5-6. No listhesis. Skull base and vertebrae: No acute fracture. No primary bone lesion or focal pathologic process. Soft tissues and spinal canal: No prevertebral fluid or swelling. No visible canal hematoma. Disc levels:  No significant cervical degenerative changes. Upper chest: Negative. Other: Endotracheal tube and enteric tubes extend below the field of view. IMPRESSION: CT head: 1. Left parietal scalp soft tissue thickening compatible with contusion. No displaced calvarial fracture. 2. No acute intracranial abnormality. 3. Otherwise unremarkable CT of the head.  CT cervical spine: No acute fracture or dislocation. Unremarkable CT of cervical spine. Electronically Signed   By: Mitzi Hansen M.D.   On: 08/31/2016 21:37   Dg Chest Port 1 View  Result Date: 08/31/2016 CLINICAL DATA:  Altered mental status.  Altered mental status. EXAM: PORTABLE CHEST 1 VIEW COMPARISON:  Radiograph 10/22/2009 FINDINGS: Endotracheal tube at the thoracic inlet 5.7 cm from the carina. Tip and side-port of the enteric tube below the diaphragm in the stomach. The cardiomediastinal contours are normal. The lungs are clear. Pulmonary vasculature is normal. No consolidation, pleural effusion, or pneumothorax. No acute osseous abnormalities are seen. IMPRESSION: 1. Endotracheal tube at the thoracic inlet 5.7 cm from the carina. Enteric tube in place. 2. Clear lungs. Electronically Signed   By: Rubye Oaks M.D.   On: 08/31/2016 20:34     Procedures Procedures (including critical care time)  Medications Ordered in ED Medications  ziprasidone (GEODON) injection 20 mg (0 mg Intramuscular Hold 08/31/16 2137)  propofol (DIPRIVAN) 1000 MG/100ML infusion (30 mcg/kg/min  70 kg Intravenous New Bag/Given 08/31/16 2137)  sodium chloride 0.9 % bolus 1,000 mL (0 mLs Intravenous Stopped 08/31/16 2212)    And  0.9 %  sodium chloride infusion ( Intravenous New Bag/Given 08/31/16 2137)  vancomycin (VANCOCIN) IVPB 1000 mg/200 mL premix (1,000 mg Intravenous New Bag/Given 08/31/16 2137)  LORazepam (ATIVAN) 2 MG/ML injection (2 mg  Given 08/31/16 2136)  LORazepam (ATIVAN) 2 MG/ML injection (2 mg  Given 08/31/16 2136)  levETIRAcetam (KEPPRA) 1,000 mg in sodium chloride 0.9 % 100 mL IVPB (0 mg Intravenous Stopped 08/31/16 2212)  LORazepam (ATIVAN) 2 MG/ML injection (2 mg  Given 08/31/16 2136)  piperacillin-tazobactam (ZOSYN) IVPB 3.375 g (0 g Intravenous Stopped 08/31/16 2212)  sodium chloride 0.9 % bolus 1,000 mL (0 mLs Intravenous Stopped 08/31/16 2212)    And  sodium chloride 0.9 % bolus 1,000 mL (0 mLs Intravenous Stopped 08/31/16 2212)    And  sodium chloride 0.9 % bolus 250 mL (0 mLs Intravenous Stopped 08/31/16 2212)     Initial Impression / Assessment and Plan / ED Course  I have reviewed the triage vital signs and the nursing notes.  Pertinent labs & imaging results that were available during my care of the patient were reviewed by me and considered in my medical decision making (see chart for details).    1- agitated delirium- likely postictal but cannot rule out toxicologic or infectious etiology.  No evidence of acute intracranial abnormality on ct. Patient very agitated and given ativan 10 mg geodon without control of agitation- patient very combative and concern for harm to self, staff, and inability to evaluate.  2- patient intubated and sedated secondary to #1 3- elevated lactic acid- likely secondary to #1 however patient  treated with abx to cover for possible infectious etiology 4- seizure- patient given keppra here.   CRITICAL CARE Performed by: Hilario Quarry Total critical care time: 70 minutes Critical care time was exclusive of separately billable procedures and treating other patients. Critical care was necessary to treat or prevent imminent or life-threatening deterioration. Critical care was time spent personally by me on the following activities: development of treatment plan with patient and/or surrogate as well as nursing, discussions with consultants, evaluation of patient's response to treatment, examination of patient, obtaining history from patient or surrogate, ordering and performing treatments and interventions, ordering and review of laboratory studies, ordering and review of radiographic studies, pulse oximetry and re-evaluation of patient's condition.   Final Clinical  Impressions(s) / ED Diagnoses   Final diagnoses:  Altered mental status, unspecified altered mental status type    New Prescriptions New Prescriptions   No medications on file     Margarita Grizzle, MD 08/31/16 2353

## 2016-08-31 NOTE — ED Notes (Signed)
Pt transported to CT with this RN 

## 2016-08-31 NOTE — ED Triage Notes (Signed)
BIB EMS from home, pt had witnessed seizure. Pt postictal and became combative en route. Given 5mg  Versed. VSS.   Upon arrival to ED pt still combative and not following commands. For pt and staff safety pt intubated after attempt of giving 7mg  Ativan IV.   After intubation pt still seizing, another 2mg  Ativan given and 1000mg  Keppra started. Pt has been placed in restraints for safety and Propofol has been started at 3615mcg/min. Pt has had total of 220mg  bolus of propofol to keep sedated thus far.

## 2016-08-31 NOTE — ED Notes (Signed)
Delay in addl lab draw,  Xray currently in room.

## 2016-09-01 ENCOUNTER — Inpatient Hospital Stay (HOSPITAL_COMMUNITY): Payer: Self-pay

## 2016-09-01 DIAGNOSIS — R4182 Altered mental status, unspecified: Secondary | ICD-10-CM

## 2016-09-01 DIAGNOSIS — J9601 Acute respiratory failure with hypoxia: Secondary | ICD-10-CM

## 2016-09-01 DIAGNOSIS — R569 Unspecified convulsions: Secondary | ICD-10-CM

## 2016-09-01 LAB — BASIC METABOLIC PANEL
ANION GAP: 6 (ref 5–15)
BUN: 13 mg/dL (ref 6–20)
CHLORIDE: 109 mmol/L (ref 101–111)
CO2: 23 mmol/L (ref 22–32)
Calcium: 7.7 mg/dL — ABNORMAL LOW (ref 8.9–10.3)
Creatinine, Ser: 1.01 mg/dL (ref 0.61–1.24)
GFR calc non Af Amer: >60 mL/min (ref >60)
GLUCOSE: 82 mg/dL (ref 65–99)
Potassium: 3.8 mmol/L (ref 3.5–5.1)
Sodium: 138 mmol/L (ref 135–145)

## 2016-09-01 LAB — BLOOD GAS, ARTERIAL
ACID-BASE DEFICIT: 3.8 mmol/L — AB (ref 0.0–2.0)
BICARBONATE: 20.1 mmol/L (ref 20.0–28.0)
Drawn by: 437071
FIO2: 70
LHR: 16 {breaths}/min
MECHVT: 660 mL
O2 SAT: 99.4 %
PATIENT TEMPERATURE: 99.9
PEEP/CPAP: 5 cmH2O
PH ART: 7.395 (ref 7.350–7.450)
PO2 ART: 313 mmHg — AB (ref 83.0–108.0)
pCO2 arterial: 33.8 mmHg (ref 32.0–48.0)

## 2016-09-01 LAB — GLUCOSE, CAPILLARY
GLUCOSE-CAPILLARY: 80 mg/dL (ref 65–99)
GLUCOSE-CAPILLARY: 83 mg/dL (ref 65–99)
Glucose-Capillary: 85 mg/dL (ref 65–99)

## 2016-09-01 LAB — HIV ANTIBODY (ROUTINE TESTING W REFLEX): HIV Screen 4th Generation wRfx: NONREACTIVE

## 2016-09-01 LAB — CBC
HEMATOCRIT: 37.6 % — AB (ref 39.0–52.0)
HEMOGLOBIN: 12.7 g/dL — AB (ref 13.0–17.0)
MCH: 30.8 pg (ref 26.0–34.0)
MCHC: 33.8 g/dL (ref 30.0–36.0)
MCV: 91 fL (ref 78.0–100.0)
Platelets: 258 10*3/uL (ref 150–400)
RBC: 4.13 MIL/uL — ABNORMAL LOW (ref 4.22–5.81)
RDW: 13.1 % (ref 11.5–15.5)
WBC: 15.3 10*3/uL — AB (ref 4.0–10.5)

## 2016-09-01 LAB — PHOSPHORUS: Phosphorus: 3.4 mg/dL (ref 2.5–4.6)

## 2016-09-01 LAB — MRSA PCR SCREENING: MRSA by PCR: NEGATIVE

## 2016-09-01 LAB — MAGNESIUM: Magnesium: 2.3 mg/dL (ref 1.7–2.4)

## 2016-09-01 LAB — AMMONIA: AMMONIA: 43 umol/L — AB (ref 9–35)

## 2016-09-01 LAB — CK: Total CK: 1064 U/L — ABNORMAL HIGH (ref 49–397)

## 2016-09-01 LAB — PROCALCITONIN: Procalcitonin: <0.1 ng/mL

## 2016-09-01 LAB — LACTIC ACID, PLASMA
LACTIC ACID, VENOUS: 0.7 mmol/L (ref 0.5–1.9)
Lactic Acid, Venous: 1.9 mmol/L (ref 0.5–1.9)

## 2016-09-01 MED ORDER — LEVETIRACETAM 500 MG/5ML IV SOLN
500.0000 mg | Freq: Two times a day (BID) | INTRAVENOUS | Status: DC
  Administered 2016-09-01 (×2): 500 mg via INTRAVENOUS
  Filled 2016-09-01 (×3): qty 5

## 2016-09-01 MED ORDER — LEVETIRACETAM 500 MG PO TABS: 500.0000 mg | ORAL_TABLET | Freq: Two times a day (BID) | ORAL | 6 refills | Status: AC

## 2016-09-01 NOTE — H&P (Signed)
PULMONARY / CRITICAL CARE MEDICINE   Name: Jared Gay MRN: [id number] DOB: 09/13/1990    ADMISSION DATE:  08/31/2016 CONSULTATION DATE:  08/31/16  REFERRING MD:  Dr. Rosalia Hammersay  CHIEF COMPLAINT:  Seizures  HISTORY OF PRESENT ILLNESS:   25yo with known seizure disorder has been off his antiepileptic medications for the past 3 years presented after a witnessed seizure at home. Mother and father are historians. Patient was outside working in the yard when had generalized seizure that was witnessed by a neighbor who called EMS. EMS gave the patient ativan. Patient was initially postictal but then became combative en route and was given 5mg  Versed. Upon arrival to ED patient was still combative requiring additional ativan x1 and started on Keppra but then required propofol for sedation and was intubated.  PAST MEDICAL HISTORY :  He  has a past medical history of Asthma; Gastritis; and Seizures (HCC).  PAST SURGICAL HISTORY: He  has a past surgical history that includes Tonsillectomy.  No Known Allergies  No current facility-administered medications on file prior to encounter.    Current Outpatient Prescriptions on File Prior to Encounter  Medication Sig  . divalproex (DEPAKOTE ER) 500 MG 24 hr tablet Take 4 tablets (2,000 mg total) by mouth at bedtime. (Patient not taking: Reported on 08/31/2016)    FAMILY HISTORY:  His indicated that his mother is alive. He indicated that his father is alive. He indicated that the status of his neg hx is unknown.    SOCIAL HISTORY: He  reports that he has been smoking Cigarettes.  He has been smoking about 0.50 packs per day. He has never used smokeless tobacco. He reports that he drinks alcohol. He reports that he uses drugs, including Marijuana, about 1 time per week. Per parents, he has used opiates recreationally in the past, po percocet or vicodin occasionally. No h/o IV drug use.   REVIEW OF SYSTEMS:   Per HPI. No recent illnesses. No fever/chills.    SUBJECTIVE:  Per RN, patient sedated and on vent.  VITAL SIGNS: BP 131/73   Pulse 92   Temp 99.9 F (37.7 C)   Resp 18   Ht 6' (1.829 m)   Wt 70 kg (154 lb 5.2 oz)   SpO2 100%   BMI 20.93 kg/m   HEMODYNAMICS:    VENTILATOR SETTINGS: Vent Mode: PRVC FiO2 (%):  [70 %-100 %] 70 % Set Rate:  [16 bmp] 16 bmp Vt Set:  [409[660 mL] 660 mL PEEP:  [5 cmH20] 5 cmH20 Plateau Pressure:  [21 cmH20] 21 cmH20  INTAKE / OUTPUT: No intake/output data recorded.  PHYSICAL EXAMINATION: General:  Laying in bed, on vent. In NAD Neuro:  Sedated and nonresponsive HEENT:  [place]. PERRL. ETT in place Cardiovascular:  RRR, no murmurs Lungs:  CTAB, nonlabored breathing on vent Abdomen:  Soft, nontender, nondistended, + bowel sounds Musculoskeletal:  Normal bulk and tone Skin:  Warm and dry   LABS:  BMET  Recent Labs Lab 08/31/16 2020  NA 140  K 4.3  CL 101  CO2 15*  BUN 17  CREATININE 1.19  GLUCOSE 82    Electrolytes  Recent Labs Lab 08/31/16 2020  CALCIUM 9.2    CBC  Recent Labs Lab 08/31/16 2020  WBC 15.0*  HGB 16.5  HCT 47.7  PLT 393    Coag's No results for input(s): APTT, INR in the last 168 hours.  Sepsis Markers  Recent Labs Lab 08/31/16 2035 08/31/16 2331  LATICACIDVEN 13.56* 1.9  ABG  Recent Labs Lab 08/31/16 2132  PHART 7.315*  PCO2ART 42.5  PO2ART 85.0    Liver Enzymes  Recent Labs Lab 08/31/16 2020  AST 40  ALT 48  ALKPHOS 76  BILITOT 0.7  ALBUMIN 4.5    Cardiac Enzymes No results for input(s): TROPONINI, PROBNP in the last 168 hours.  Glucose  Recent Labs Lab 08/31/16 2054  GLUCAP 79    Imaging Ct Head Wo Contrast  Result Date: 08/31/2016 CLINICAL DATA:  25 y/o  M; witnessed seizure. EXAM: CT HEAD WITHOUT CONTRAST CT CERVICAL SPINE WITHOUT CONTRAST TECHNIQUE: Multidetector CT imaging of the head and cervical spine was performed following the standard protocol without intravenous contrast. Multiplanar CT image  reconstructions of the cervical spine were also generated. COMPARISON:  10/08/2011 CT head FINDINGS: CT HEAD FINDINGS Brain: No evidence of acute infarction, hemorrhage, hydrocephalus, extra-axial collection or mass lesion/mass effect. Vascular: No hyperdense vessel or unexpected calcification. Skull: Left parietal scalp soft tissue thickening compatible with contusion. No displaced calvarial fracture. Sinuses/Orbits: No acute finding. Other: None. CT CERVICAL SPINE FINDINGS Alignment: Straightening of cervical lordosis with mild reversal of curvature at C5-6. No listhesis. Skull base and vertebrae: No acute fracture. No primary bone lesion or focal pathologic process. Soft tissues and spinal canal: No prevertebral fluid or swelling. No visible canal hematoma. Disc levels:  No significant cervical degenerative changes. Upper chest: Negative. Other: Endotracheal tube and enteric tubes extend below the field of view. IMPRESSION: CT head: 1. Left parietal scalp soft tissue thickening compatible with contusion. No displaced calvarial fracture. 2. No acute intracranial abnormality. 3. Otherwise unremarkable CT of the head. CT cervical spine: No acute fracture or dislocation. Unremarkable CT of cervical spine. Electronically Signed   By: Mitzi Hansen M.D.   On: 08/31/2016 21:37   Ct Cervical Spine Wo Contrast  Result Date: 08/31/2016 CLINICAL DATA:  26 y/o  M; witnessed seizure. EXAM: CT HEAD WITHOUT CONTRAST CT CERVICAL SPINE WITHOUT CONTRAST TECHNIQUE: Multidetector CT imaging of the head and cervical spine was performed following the standard protocol without intravenous contrast. Multiplanar CT image reconstructions of the cervical spine were also generated. COMPARISON:  10/08/2011 CT head FINDINGS: CT HEAD FINDINGS Brain: No evidence of acute infarction, hemorrhage, hydrocephalus, extra-axial collection or mass lesion/mass effect. Vascular: No hyperdense vessel or unexpected calcification. Skull: Left  parietal scalp soft tissue thickening compatible with contusion. No displaced calvarial fracture. Sinuses/Orbits: No acute finding. Other: None. CT CERVICAL SPINE FINDINGS Alignment: Straightening of cervical lordosis with mild reversal of curvature at C5-6. No listhesis. Skull base and vertebrae: No acute fracture. No primary bone lesion or focal pathologic process. Soft tissues and spinal canal: No prevertebral fluid or swelling. No visible canal hematoma. Disc levels:  No significant cervical degenerative changes. Upper chest: Negative. Other: Endotracheal tube and enteric tubes extend below the field of view. IMPRESSION: CT head: 1. Left parietal scalp soft tissue thickening compatible with contusion. No displaced calvarial fracture. 2. No acute intracranial abnormality. 3. Otherwise unremarkable CT of the head. CT cervical spine: No acute fracture or dislocation. Unremarkable CT of cervical spine. Electronically Signed   By: Mitzi Hansen M.D.   On: 08/31/2016 21:37   Dg Chest Port 1 View  Result Date: 08/31/2016 CLINICAL DATA:  Altered mental status.  Altered mental status. EXAM: PORTABLE CHEST 1 VIEW COMPARISON:  Radiograph 10/22/2009 FINDINGS: Endotracheal tube at the thoracic inlet 5.7 cm from the carina. Tip and side-port of the enteric tube below the diaphragm in the stomach. The  cardiomediastinal contours are normal. The lungs are clear. Pulmonary vasculature is normal. No consolidation, pleural effusion, or pneumothorax. No acute osseous abnormalities are seen. IMPRESSION: 1. Endotracheal tube at the thoracic inlet 5.7 cm from the carina. Enteric tube in place. 2. Clear lungs. Electronically Signed   By: Rubye Oaks M.D.   On: 08/31/2016 20:34     STUDIES:  CXR 7/13 > clear lungs CT head 7/13 > Left parietal scalp soft tissue thickening compatible with contusion. No displaced calvarial fracture. No acute intracranial abnormality. CT C spine 7/13> No acute fracture or  dislocation.   CULTURES: Blood cultures 7/13>  ANTIBIOTICS: Zosyn 7/13>  Vancomycin 7/13>   SIGNIFICANT EVENTS: 7/13 admitted   LINES/TUBES: ETT 7/13>  NGT 7/13> Foley 7/13>  PIV x2  DISCUSSION: 26 yo male h/o seizures with medication noncompliance here with seizure, was combative and postictal requiring sedation and intubation.  ASSESSMENT / PLAN:  PULMONARY A: Acute respiratory insufficiency P:   Mechanical ventilation SBT and WUA in am PRN albuterol neb  CARDIOVASCULAR A:  No acute issues P:  Monitor on telemetry  RENAL A:   AKI P:   IV hydration Monitor BMP and UOP  GASTROINTESTINAL A:   GI ppx P:   Famotidine  HEMATOLOGIC A:   DVT ppx P:  Heparin SQ  INFECTIOUS A:   ? Sepsis Lactic acidosis P:   Monitor fever curve and WBC Trend lactate Check procalcitonin Vanc/zosyn Follow Bcx  ENDOCRINE A:   No acute issues P:   Monitor glucose  NEUROLOGIC A:   Seizures P:   RASS goal: -1, -2 Propofol gtt Check tox for drugs of abuse PRN versed PRN ativan PRN fentanyl Neurology consult  Continue Keppra   FAMILY  - Updates: Parents at bedside.    Leland Her, DO PGY-2, [facility] 09/01/2016 12:50 AM    Attending Addendum: I personally examined this patient and agree with plan detailed above. Briefly this is a 25yoM with history of seizures many years ago, previously on antiepileptic medications which he stopped taking 3 years ago. No seizures since then. He also has a history of Asthma, Gastritis, and Polysubstance abuse (morphine, oxycodone, molly, marijuana). Now admitted with seizures today. 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 He was witnessed to have a seizure in the neighbor's yard. EMS was called and patient continued to seize until they arrived. He was given Versed 5mg  then Ativan 7mg . Was intubated on arrival in ER. Sounds  as if stopped seizing at one point then started seizing again following intubation. Given another 2mg  Ativan, 1g Keppra, and started on Propofol gtt. At time of my exam, patient is undersedated, lifting head off the bed and fighting his extremity restraints. PERRL, CTA b/l, Heart RRR no m/r/g. No obvious tonic-clonic activity during time of my exam. Lactic acidosis and leukocytosis likely from the seizure itself. CXR clear. UA no infection. Hold off on antibiotics. CT Head negative. Will check UDS. Patient's parents say its possible he may have smoked synthetic cannabinoid which can sometimes cause seizures. Ordered EEG and Neurology consult.  60 minutes critical care time  Milana Obey, MD Pulmonary & Critical Care

## 2016-09-01 NOTE — Progress Notes (Signed)
Pt. given ice chips and sat up right.  No s/s of distress or aspiration.  Patient stated "no trouble".  Patient then given water with and without a straw.  No s/s of distress or aspiration.  Also tested with applesauce and graham crackers with peanut butter.  Patient did well with all food.  No s/s of aspiration.  Will place patient on Regular diet, per order.

## 2016-09-01 NOTE — Progress Notes (Signed)
PULMONARY / CRITICAL CARE MEDICINE   Name: Verdell CarmineCorey L Smay MRN: [id number] DOB: 05/24/1990    ADMISSION DATE:  08/31/2016 CONSULTATION DATE:  08/31/16  REFERRING MD:  Dr. Rosalia Hammersay  CHIEF COMPLAINT:  Seizures  HISTORY OF PRESENT ILLNESS:   25yo with known seizure disorder has been off his antiepileptic medications for the past 3 years presented after a witnessed seizure at home. Mother and father are historians. Patient was outside working in the yard when had generalized seizure that was witnessed by a neighbor who called EMS. EMS gave the patient ativan. Patient was initially postictal but then became combative en route and was given 5mg  Versed. Upon arrival to ED patient was still combative requiring additional ativan x1 and started on Keppra but then required propofol for sedation and was intubated.  PAST MEDICAL HISTORY :  He  has a past medical history of Asthma; Gastritis; and Seizures (HCC).  PAST SURGICAL HISTORY: He  has a past surgical history that includes Tonsillectomy.  No Known Allergies  No current facility-administered medications on file prior to encounter.    Current Outpatient Prescriptions on File Prior to Encounter  Medication Sig  . divalproex (DEPAKOTE ER) 500 MG 24 hr tablet Take 4 tablets (2,000 mg total) by mouth at bedtime. (Patient not taking: Reported on 08/31/2016)    FAMILY HISTORY:  His indicated that his mother is alive. He indicated that his father is alive. He indicated that the status of his neg hx is unknown.    SOCIAL HISTORY: He  reports that he has been smoking Cigarettes.  He has been smoking about 0.50 packs per day. He has never used smokeless tobacco. He reports that he drinks alcohol. He reports that he uses drugs, including Marijuana, about 1 time per week. Per parents, he has used opiates recreationally in the past, po percocet or vicodin occasionally. No h/o IV drug use.   REVIEW OF SYSTEMS:   Per HPI. No recent illnesses. No fever/chills.    SUBJECTIVE:  Awake on vent, agitated Pulling good TV on PSV 5/5  VITAL SIGNS: BP (!) 105/50   Pulse (!) 56   Temp 98.5 F (36.9 C) (Oral)   Resp 16   Ht 6' (1.829 m)   Wt 155 lb 10.3 oz (70.6 kg)   SpO2 100%   BMI 21.11 kg/m   HEMODYNAMICS:    VENTILATOR SETTINGS: Vent Mode: PRVC FiO2 (%):  [40 %-100 %] 40 % Set Rate:  [16 bmp] 16 bmp Vt Set:  [660 mL] 660 mL PEEP:  [5 cmH20] 5 cmH20 Plateau Pressure:  [21 cmH20-22 cmH20] 22 cmH20  INTAKE / OUTPUT: I/O last 3 completed shifts: In: 4236.9 [I.V.:4081.9; IV Piggyback:155] Out: 550 [Urine:550]  PHYSICAL EXAMINATION: Gen:      No acute distress HEENT:  EOMI, sclera anicteric, ETT in place Neck:     No masses; no thyromegaly Lungs:    Clear to auscultation bilaterally; normal respiratory effort CV:         Regular rate and rhythm; no murmurs Abd:      + bowel sounds; soft, non-tender; no palpable masses, no distension Ext:    No edema; adequate peripheral perfusion Skin:      Warm and dry; no rash Neuro: alert and oriented x 3, no focal deficits  LABS:  BMET  Recent Labs Lab 08/31/16 2020 09/01/16 0236  NA 140 138  K 4.3 3.8  CL 101 109  CO2 15* 23  BUN 17 13  CREATININE 1.19 1.01  GLUCOSE 82 82    Electrolytes  Recent Labs Lab 08/31/16 2020 09/01/16 0236  CALCIUM 9.2 7.7*  MG  --  2.3  PHOS  --  3.4    CBC  Recent Labs Lab 08/31/16 2020 09/01/16 0236  WBC 15.0* 15.3*  HGB 16.5 12.7*  HCT 47.7 37.6*  PLT 393 258    Coag's No results for input(s): APTT, INR in the last 168 hours.  Sepsis Markers  Recent Labs Lab 08/31/16 2035 08/31/16 2331 09/01/16 0236  LATICACIDVEN 13.56* 1.9 0.7  PROCALCITON  --   --  <0.10    ABG  Recent Labs Lab 08/31/16 2132 09/01/16 0330  PHART 7.315* 7.395  PCO2ART 42.5 33.8  PO2ART 85.0 313*    Liver Enzymes  Recent Labs Lab 08/31/16 2020  AST 40  ALT 48  ALKPHOS 76  BILITOT 0.7  ALBUMIN 4.5    Cardiac Enzymes No results for  input(s): TROPONINI, PROBNP in the last 168 hours.  Glucose  Recent Labs Lab 08/31/16 2054 09/01/16 0124 09/01/16 0338 09/01/16 0735  GLUCAP 79 80 83 85    Imaging Ct Head Wo Contrast  Result Date: 08/31/2016 CLINICAL DATA:  26 y/o  M; witnessed seizure. EXAM: CT HEAD WITHOUT CONTRAST CT CERVICAL SPINE WITHOUT CONTRAST TECHNIQUE: Multidetector CT imaging of the head and cervical spine was performed following the standard protocol without intravenous contrast. Multiplanar CT image reconstructions of the cervical spine were also generated. COMPARISON:  10/08/2011 CT head FINDINGS: CT HEAD FINDINGS Brain: No evidence of acute infarction, hemorrhage, hydrocephalus, extra-axial collection or mass lesion/mass effect. Vascular: No hyperdense vessel or unexpected calcification. Skull: Left parietal scalp soft tissue thickening compatible with contusion. No displaced calvarial fracture. Sinuses/Orbits: No acute finding. Other: None. CT CERVICAL SPINE FINDINGS Alignment: Straightening of cervical lordosis with mild reversal of curvature at C5-6. No listhesis. Skull base and vertebrae: No acute fracture. No primary bone lesion or focal pathologic process. Soft tissues and spinal canal: No prevertebral fluid or swelling. No visible canal hematoma. Disc levels:  No significant cervical degenerative changes. Upper chest: Negative. Other: Endotracheal tube and enteric tubes extend below the field of view. IMPRESSION: CT head: 1. Left parietal scalp soft tissue thickening compatible with contusion. No displaced calvarial fracture. 2. No acute intracranial abnormality. 3. Otherwise unremarkable CT of the head. CT cervical spine: No acute fracture or dislocation. Unremarkable CT of cervical spine. Electronically Signed   By: Mitzi Hansen M.D.   On: 08/31/2016 21:37   Ct Cervical Spine Wo Contrast  Result Date: 08/31/2016 CLINICAL DATA:  26 y/o  M; witnessed seizure. EXAM: CT HEAD WITHOUT CONTRAST CT  CERVICAL SPINE WITHOUT CONTRAST TECHNIQUE: Multidetector CT imaging of the head and cervical spine was performed following the standard protocol without intravenous contrast. Multiplanar CT image reconstructions of the cervical spine were also generated. COMPARISON:  10/08/2011 CT head FINDINGS: CT HEAD FINDINGS Brain: No evidence of acute infarction, hemorrhage, hydrocephalus, extra-axial collection or mass lesion/mass effect. Vascular: No hyperdense vessel or unexpected calcification. Skull: Left parietal scalp soft tissue thickening compatible with contusion. No displaced calvarial fracture. Sinuses/Orbits: No acute finding. Other: None. CT CERVICAL SPINE FINDINGS Alignment: Straightening of cervical lordosis with mild reversal of curvature at C5-6. No listhesis. Skull base and vertebrae: No acute fracture. No primary bone lesion or focal pathologic process. Soft tissues and spinal canal: No prevertebral fluid or swelling. No visible canal hematoma. Disc levels:  No significant cervical degenerative changes. Upper chest: Negative. Other: Endotracheal tube and enteric tubes  extend below the field of view. IMPRESSION: CT head: 1. Left parietal scalp soft tissue thickening compatible with contusion. No displaced calvarial fracture. 2. No acute intracranial abnormality. 3. Otherwise unremarkable CT of the head. CT cervical spine: No acute fracture or dislocation. Unremarkable CT of cervical spine. Electronically Signed   By: Mitzi Hansen M.D.   On: 08/31/2016 21:37   Dg Chest Port 1 View  Result Date: 09/01/2016 CLINICAL DATA:  Hx of seizures, asthma. Patient on vent. Smoker. EXAM: PORTABLE CHEST 1 VIEW COMPARISON:  08/31/2016 FINDINGS: There is new consolidation in the left lung base which is most likely atelectasis. Remainder of the lungs is clear. No pneumothorax. No convincing pleural effusion. Endotracheal tube and nasogastric tube are well positioned and stable. IMPRESSION: 1. New left lung base  opacity, consistent with atelectasis or pneumonia. 2. No other change. Support apparatus is stable and well positioned. Electronically Signed   By: Amie Portland M.D.   On: 09/01/2016 07:22   Dg Chest Port 1 View  Result Date: 08/31/2016 CLINICAL DATA:  Altered mental status.  Altered mental status. EXAM: PORTABLE CHEST 1 VIEW COMPARISON:  Radiograph 10/22/2009 FINDINGS: Endotracheal tube at the thoracic inlet 5.7 cm from the carina. Tip and side-port of the enteric tube below the diaphragm in the stomach. The cardiomediastinal contours are normal. The lungs are clear. Pulmonary vasculature is normal. No consolidation, pleural effusion, or pneumothorax. No acute osseous abnormalities are seen. IMPRESSION: 1. Endotracheal tube at the thoracic inlet 5.7 cm from the carina. Enteric tube in place. 2. Clear lungs. Electronically Signed   By: Rubye Oaks M.D.   On: 08/31/2016 20:34     STUDIES:  CXR 7/13 > clear lungs CT head 7/13 > Left parietal scalp soft tissue thickening compatible with contusion. No displaced calvarial fracture. No acute intracranial abnormality. CT C spine 7/13> No acute fracture or dislocation.  UDS 7/13 > Postive for THC, opiates, benzo  CULTURES: Blood cultures 7/13>  ANTIBIOTICS: Zosyn 7/13> Vancomycin 7/13>   SIGNIFICANT EVENTS: 7/13 admitted   LINES/TUBES: ETT 7/13>  NGT 7/13> Foley 7/13>  PIV x2  DISCUSSION: 26 yo male h/o seizures with medication noncompliance here with seizure, was combative and postictal requiring sedation and intubation.  ASSESSMENT / PLAN:  PULMONARY A: Acute respiratory insufficiency P:   Plan for extubation today  CARDIOVASCULAR A:  No acute issues P:  Tele monitoring  RENAL A:   AKI P:   Monitor urine output and Cr  INFECTIOUS A:   ? Sepsis Lactic acidosis P:   Monitor fever curve and WBC Pct is low Stop antibiotics. Follow cultures  ENDOCRINE A:   No acute issues P:   Monitor  glucose  NEUROLOGIC A:   Seizures P:   Propofol gtt. Stop for extubation Continue keppra  FAMILY  - Updates: Parents at bedside.   BEST PRACTICE / DISPOSITION Level of Care:  ICU Primary Service:  PCCM Code Status:  Neurology Diet:  NPO DVT Px: Hep SQ GI Px:  Pepcid  The patient is critically ill with multiple organ system failure and requires high complexity decision making for assessment and support, frequent evaluation and titration of therapies, advanced monitoring, review of radiographic studies and interpretation of complex data.   Additional Critical Care Time devoted to patient care services, exclusive of separately billable procedures, described in this note is 35 minutes.   Chilton Greathouse MD [facility] Pager [phone] If no answer or after 3pm call: [phone] 09/01/2016,  7:49 AM

## 2016-09-01 NOTE — Plan of Care (Signed)
Problem: Safety: Goal: Ability to remain free from injury will improve Outcome: Not Met (add Reason) Patient leaving AMA  Problem: Health Behavior/Discharge Planning: Goal: Ability to manage health-related needs will improve Outcome: Not Met (add Reason) Patient leaving AMA  Problem: Pain Managment: Goal: General experience of comfort will improve Outcome: Not Met (add Reason) Patient leaving AMA  Problem: Physical Regulation: Goal: Ability to maintain clinical measurements within normal limits will improve Outcome: Not Met (add Reason) Patient leaving AMA Goal: Will remain free from infection Outcome: Not Met (add Reason) Patient leaving AMA  Problem: Skin Integrity: Goal: Risk for impaired skin integrity will decrease Outcome: Not Met (add Reason) Patient leaving AMA  Problem: Tissue Perfusion: Goal: Risk factors for ineffective tissue perfusion will decrease Outcome: Not Met (add Reason) Patient leaving AMA  Problem: Activity: Goal: Risk for activity intolerance will decrease Outcome: Not Met (add Reason) Patient leaving AMA

## 2016-09-01 NOTE — Procedures (Signed)
Extubation Procedure Note  Patient Details:   Name: Jared CarmineCorey L Braman DOB: 07/12/1990 MRN: [id number]   Airway Documentation:     Evaluation  O2 sats: stable throughout Complications: No apparent complications Patient did tolerate procedure well. Bilateral Breath Sounds: Clear   Yes, pt able to vocalize.   Pt extubated at this time. No stridor noted. Pt able to vocalize and has strong productive cough.   Loyal Jacobsonhompson, Shanitha Twining [facility] [place] 09/01/2016, 7:53 AM

## 2016-09-01 NOTE — Consult Note (Signed)
Reason for Consult: Seizure Referring Physician: Dr. Romana Juniper is an 26 y.o. male.  HPI: He has a known history of epilepsy, but was non-compliant with meds.  He was witnessed to have a GTCS in the front yard today. EMS where called and gave him Ativan.  However, as the effect wore off he became more combat and required intubation and further sedation.  He was loaded with Keppra 1000 mg but has not been ordered any maintenance dose.  No other seizures.  Currently on propofol 15 mcg/kg/min and Fentanyl 75 mcg/hr.  Labs show a lactic acidosis with elevated WBC all consistent with GTCS.  His ammonia level is also high for some reason at 140.    Past Medical History:  Diagnosis Date  . Asthma   . Gastritis   . Seizures ([place])     Past Surgical History:  Procedure Laterality Date  . TONSILLECTOMY      Family History  Problem Relation Age of Onset  . Colon cancer Neg Hx     Social History:  reports that he has been smoking Cigarettes.  He has been smoking about 0.50 packs per day. He has never used smokeless tobacco. He reports that he drinks alcohol. He reports that he uses drugs, including Marijuana, about 1 time per week.  Allergies: No Known Allergies  Prior to Admission medications   Medication Sig Start Date End Date Taking? Authorizing Provider  divalproex (DEPAKOTE ER) 500 MG 24 hr tablet Take 4 tablets (2,000 mg total) by mouth at bedtime. Patient not taking: Reported on 08/31/2016 01/04/14   Dennie Bible, NP    Medications:  Scheduled: . chlorhexidine gluconate (MEDLINE KIT)  15 mL Mouth Rinse BID  . fentaNYL (SUBLIMAZE) injection  50 mcg Intravenous Once  . heparin  5,000 Units Subcutaneous Q8H  . mouth rinse  15 mL Mouth Rinse QID    Results for orders placed or performed during the hospital encounter of 08/31/16 (from the past 48 hour(s))  Triglycerides     Status: Abnormal   Collection Time: 08/31/16  8:20 PM  Result Value Ref Range    Triglycerides 152 (H) <150 mg/dL  Comprehensive metabolic panel     Status: Abnormal   Collection Time: 08/31/16  8:20 PM  Result Value Ref Range   Sodium 140 135 - 145 mmol/L   Potassium 4.3 3.5 - 5.1 mmol/L   Chloride 101 101 - 111 mmol/L   CO2 15 (L) 22 - 32 mmol/L   Glucose, Bld 82 65 - 99 mg/dL   BUN 17 6 - 20 mg/dL   Creatinine, Ser 1.19 0.61 - 1.24 mg/dL   Calcium 9.2 8.9 - 10.3 mg/dL   Total Protein 7.4 6.5 - 8.1 g/dL   Albumin 4.5 3.5 - 5.0 g/dL   AST 40 15 - 41 U/L   ALT 48 17 - 63 U/L   Alkaline Phosphatase 76 38 - 126 U/L   Total Bilirubin 0.7 0.3 - 1.2 mg/dL   GFR calc non Af Amer >60 >60 mL/min   GFR calc Af Amer >60 >60 mL/min    Comment: (NOTE) The eGFR has been calculated using the CKD EPI equation. This calculation has not been validated in all clinical situations. eGFR's persistently <60 mL/min signify possible Chronic Kidney Disease.    Anion gap 24 (H) 5 - 15  CBC WITH DIFFERENTIAL     Status: Abnormal   Collection Time: 08/31/16  8:20 PM  Result Value Ref Range  WBC 15.0 (H) 4.0 - 10.5 K/uL   RBC 5.10 4.22 - 5.81 MIL/uL   Hemoglobin 16.5 13.0 - 17.0 g/dL   HCT 47.7 39.0 - 52.0 %   MCV 93.5 78.0 - 100.0 fL   MCH 32.4 26.0 - 34.0 pg   MCHC 34.6 30.0 - 36.0 g/dL   RDW 12.9 11.5 - 15.5 %   Platelets 393 150 - 400 K/uL    Comment: PLATELET CLUMPS NOTED ON SMEAR, COUNT APPEARS ADEQUATE   Neutrophils Relative % 44 %   Lymphocytes Relative 47 %   Monocytes Relative 7 %   Eosinophils Relative 2 %   Basophils Relative 0 %   Neutro Abs 6.6 1.7 - 7.7 K/uL   Lymphs Abs 7.0 (H) 0.7 - 4.0 K/uL   Monocytes Absolute 1.1 (H) 0.1 - 1.0 K/uL   Eosinophils Absolute 0.3 0.0 - 0.7 K/uL   Basophils Absolute 0.0 0.0 - 0.1 K/uL   WBC Morphology MILD LEFT SHIFT (1-5% METAS, OCC MYELO, OCC BANDS)     Comment: ABSOLUTE LYMPHOCYTOSIS  CK     Status: None   Collection Time: 08/31/16  8:20 PM  Result Value Ref Range   Total CK 222 49 - 397 U/L  Ammonia     Status:  Abnormal   Collection Time: 08/31/16  8:33 PM  Result Value Ref Range   Ammonia 140 (H) 9 - 35 umol/L  Ethanol     Status: None   Collection Time: 08/31/16  8:33 PM  Result Value Ref Range   Alcohol, Ethyl (B) <5 <5 mg/dL    Comment:        LOWEST DETECTABLE LIMIT FOR SERUM ALCOHOL IS 5 mg/dL FOR MEDICAL PURPOSES ONLY   I-Stat CG4 Lactic Acid, ED     Status: Abnormal   Collection Time: 08/31/16  8:35 PM  Result Value Ref Range   Lactic Acid, Venous 13.56 (HH) 0.5 - 1.9 mmol/L   Comment NOTIFIED PHYSICIAN   Urinalysis, Complete w Microscopic     Status: Abnormal   Collection Time: 08/31/16  8:38 PM  Result Value Ref Range   Color, Urine YELLOW YELLOW   APPearance HAZY (A) CLEAR   Specific Gravity, Urine 1.016 1.005 - 1.030   pH 5.0 5.0 - 8.0   Glucose, UA NEGATIVE NEGATIVE mg/dL   Hgb urine dipstick MODERATE (A) NEGATIVE   Bilirubin Urine NEGATIVE NEGATIVE   Ketones, ur NEGATIVE NEGATIVE mg/dL   Protein, ur 100 (A) NEGATIVE mg/dL   Nitrite NEGATIVE NEGATIVE   Leukocytes, UA NEGATIVE NEGATIVE   RBC / HPF 0-5 0 - 5 RBC/hpf   WBC, UA 0-5 0 - 5 WBC/hpf   Bacteria, UA FEW (A) NONE SEEN   Squamous Epithelial / LPF 0-5 (A) NONE SEEN   Mucous PRESENT    Hyaline Casts, UA PRESENT    Amorphous Crystal PRESENT   Rapid urine drug screen (hospital performed)     Status: Abnormal   Collection Time: 08/31/16  8:38 PM  Result Value Ref Range   Opiates POSITIVE (A) NONE DETECTED   Cocaine NONE DETECTED NONE DETECTED   Benzodiazepines POSITIVE (A) NONE DETECTED   Amphetamines NONE DETECTED NONE DETECTED   Tetrahydrocannabinol POSITIVE (A) NONE DETECTED   Barbiturates NONE DETECTED NONE DETECTED    Comment:        DRUG SCREEN FOR MEDICAL PURPOSES ONLY.  IF CONFIRMATION IS NEEDED FOR ANY PURPOSE, NOTIFY LAB WITHIN 5 DAYS.        LOWEST DETECTABLE LIMITS  FOR URINE DRUG SCREEN Drug Class       Cutoff (ng/mL) Amphetamine      1000 Barbiturate      200 Benzodiazepine    378 Tricyclics       588 Opiates          300 Cocaine          300 THC              50   CBG monitoring, ED     Status: None   Collection Time: 08/31/16  8:54 PM  Result Value Ref Range   Glucose-Capillary 79 65 - 99 mg/dL  I-Stat arterial blood gas, ED     Status: Abnormal   Collection Time: 08/31/16  9:32 PM  Result Value Ref Range   pH, Arterial 7.315 (L) 7.350 - 7.450   pCO2 arterial 42.5 32.0 - 48.0 mmHg   pO2, Arterial 85.0 83.0 - 108.0 mmHg   Bicarbonate 21.6 20.0 - 28.0 mmol/L   TCO2 23 0 - 100 mmol/L   O2 Saturation 95.0 %   Acid-base deficit 4.0 (H) 0.0 - 2.0 mmol/L   Patient temperature 98.6 F    Collection site RADIAL, ALLEN'S TEST ACCEPTABLE    Drawn by RT    Sample type ARTERIAL   Lactic acid, plasma     Status: None   Collection Time: 08/31/16 11:31 PM  Result Value Ref Range   Lactic Acid, Venous 1.9 0.5 - 1.9 mmol/L  Glucose, capillary     Status: None   Collection Time: 09/01/16  1:24 AM  Result Value Ref Range   Glucose-Capillary 80 65 - 99 mg/dL   Comment 1 Capillary Specimen     Ct Head Wo Contrast  Result Date: 08/31/2016 CLINICAL DATA:  26 y/o  M; witnessed seizure. EXAM: CT HEAD WITHOUT CONTRAST CT CERVICAL SPINE WITHOUT CONTRAST TECHNIQUE: Multidetector CT imaging of the head and cervical spine was performed following the standard protocol without intravenous contrast. Multiplanar CT image reconstructions of the cervical spine were also generated. COMPARISON:  10/08/2011 CT head FINDINGS: CT HEAD FINDINGS Brain: No evidence of acute infarction, hemorrhage, hydrocephalus, extra-axial collection or mass lesion/mass effect. Vascular: No hyperdense vessel or unexpected calcification. Skull: Left parietal scalp soft tissue thickening compatible with contusion. No displaced calvarial fracture. Sinuses/Orbits: No acute finding. Other: None. CT CERVICAL SPINE FINDINGS Alignment: Straightening of cervical lordosis with mild reversal of curvature at C5-6. No  listhesis. Skull base and vertebrae: No acute fracture. No primary bone lesion or focal pathologic process. Soft tissues and spinal canal: No prevertebral fluid or swelling. No visible canal hematoma. Disc levels:  No significant cervical degenerative changes. Upper chest: Negative. Other: Endotracheal tube and enteric tubes extend below the field of view. IMPRESSION: CT head: 1. Left parietal scalp soft tissue thickening compatible with contusion. No displaced calvarial fracture. 2. No acute intracranial abnormality. 3. Otherwise unremarkable CT of the head. CT cervical spine: No acute fracture or dislocation. Unremarkable CT of cervical spine. Electronically Signed   By: Kristine Garbe M.D.   On: 08/31/2016 21:37   Ct Cervical Spine Wo Contrast  Result Date: 08/31/2016 CLINICAL DATA:  26 y/o  M; witnessed seizure. EXAM: CT HEAD WITHOUT CONTRAST CT CERVICAL SPINE WITHOUT CONTRAST TECHNIQUE: Multidetector CT imaging of the head and cervical spine was performed following the standard protocol without intravenous contrast. Multiplanar CT image reconstructions of the cervical spine were also generated. COMPARISON:  10/08/2011 CT head FINDINGS: CT HEAD FINDINGS Brain: No evidence  of acute infarction, hemorrhage, hydrocephalus, extra-axial collection or mass lesion/mass effect. Vascular: No hyperdense vessel or unexpected calcification. Skull: Left parietal scalp soft tissue thickening compatible with contusion. No displaced calvarial fracture. Sinuses/Orbits: No acute finding. Other: None. CT CERVICAL SPINE FINDINGS Alignment: Straightening of cervical lordosis with mild reversal of curvature at C5-6. No listhesis. Skull base and vertebrae: No acute fracture. No primary bone lesion or focal pathologic process. Soft tissues and spinal canal: No prevertebral fluid or swelling. No visible canal hematoma. Disc levels:  No significant cervical degenerative changes. Upper chest: Negative. Other: Endotracheal  tube and enteric tubes extend below the field of view. IMPRESSION: CT head: 1. Left parietal scalp soft tissue thickening compatible with contusion. No displaced calvarial fracture. 2. No acute intracranial abnormality. 3. Otherwise unremarkable CT of the head. CT cervical spine: No acute fracture or dislocation. Unremarkable CT of cervical spine. Electronically Signed   By: Kristine Garbe M.D.   On: 08/31/2016 21:37   Dg Chest Port 1 View  Result Date: 08/31/2016 CLINICAL DATA:  Altered mental status.  Altered mental status. EXAM: PORTABLE CHEST 1 VIEW COMPARISON:  Radiograph 10/22/2009 FINDINGS: Endotracheal tube at the thoracic inlet 5.7 cm from the carina. Tip and side-port of the enteric tube below the diaphragm in the stomach. The cardiomediastinal contours are normal. The lungs are clear. Pulmonary vasculature is normal. No consolidation, pleural effusion, or pneumothorax. No acute osseous abnormalities are seen. IMPRESSION: 1. Endotracheal tube at the thoracic inlet 5.7 cm from the carina. Enteric tube in place. 2. Clear lungs. Electronically Signed   By: Jeb Levering M.D.   On: 08/31/2016 20:34    ROS Blood pressure (!) 105/52, pulse (!) 51, temperature 99.9 F (37.7 C), resp. rate 16, height 6' (1.829 m), weight 70.6 kg (155 lb 10.3 oz), SpO2 100 %. Neurologic Examination:  Intubated.  After stopping the Propofol and Fentanyl, he did open his eyes to command and followed commands with legs and arms.  No focal deficits.    Assessment/Plan:  Breakthrough GTCS in known epileptic patient secondary to non-compliance.  I will add Keppra maintenance therapy at 500 mg IV q12hr.  This can be changed to po once he is extubated which I suspect can be done tomorrow am.    I will order EEG.    His ammonia level needs to be monitored.  No evidence of liver disease on other lab parameters.  He was not taking Depakote as far as I know, may have without my knowledge.  If continues to be  high, then lactulose may need to be started.    Rogue Jury, MD 09/01/2016, 3:00 AM

## 2016-09-01 NOTE — Progress Notes (Signed)
Same day follow-up Patient seen overnight by on-call neurologist. Plan 26-year-old with a history of epilepsy noncompliant medications, who had generalized tonic-clonic seizures in his front yard yesterday. Received Ativan and became more agitated requiring intubation and sedation. Sedation turned off now and the patient is awake alert, following all commands.  Examination Awake, distressed with the endotracheal tube, and wrist restraints. Following all commands. Pupils equal round reactive to light, visual fields full, extraocular movements intact, face symmetric. 5/5 strength in all extremities. Sensation intact. DTRs 2+ all over with downgoing toes. Coordination and gait exam deferred at this time.  Assessment and plan 26 year old with a history of epilepsy was noncompliant to medications came in with breakthrough seizures.   Recommendations Continue Keppra 500 mg IV for now. Can be converted to by mouth when able to. Awaiting EEG. At high ammonia levels. Management per critical care team. Should get repeat MRI when stable, can be done as outpatient. Needs outpatient neurology f/u and education on compliance with medications. No driving per [place] law until 6 months seizure free. Seizure precautions Please call with questions   Milon DikesAshish Kosei Rhodes, MD [facility] [phone]  If 7pm to 7am, please call on call as listed on AMION.

## 2016-09-01 NOTE — Progress Notes (Signed)
Patient pull off monitor leads and walked to the bathroom on his own.  Stated he is not waiting any longer for the doctor.  Stated he wanted AMA papers.  Explained to patient, the importance of waiting for the doctor.  Patient stated he knows and is not going to wait.  AMA papers given to patient, signed with him as a witness.  All IV's removed.  Patient stated his mom is on her way with clothes.

## 2016-09-01 NOTE — Discharge Summary (Signed)
Physician Discharge Summary       Patient ID: Jared Gay MRN: [id number] DOB/AGE: 19-Aug-1990 25 y.o.  Admit date: 08/31/2016 Discharge date: 09/01/2016  Discharge Diagnoses:   Acute respiratory failure (resolved) Seizure disorder AKI (resolved) Lactic acidosis  Polysubstance abuse  Left AGAINST MEDICAL ADVICE   Detailed Hospital Course:  25yo with known seizure disorder has been off his antiepileptic medications for the past 3 years presented after a witnessed seizure at home. Mother and father are historians. Patient was outside working in the yard when had generalized seizure that was witnessed by a neighbor who called EMS. EMS gave the patient ativan. Patient was initially postictal but then became combative en route and was given 5mg  Versed. Upon arrival to ED patient was still combative requiring additional ativan x1 and started on Keppra but then required propofol for sedation and was intubated. He was extubated on 7/14. Shortly after extubation he signed papers for leaving AMA.     Discharge Plan by active problems  Seizure d/o Medical non-compliance  Left against medical advice.  Plan We did provide him w/ a prescription for keppra  Also instructed him he was NOT to drive Instructed him to re-establish w/ [facility] ASAP.     Significant Hospital tests/ studies  Consults: neurology   Discharge Exam: BP 125/68   Pulse 62   Temp 98.4 F (36.9 C) (Oral)   Resp (!) 24   Ht 6' (1.829 m)   Wt 155 lb 10.3 oz (70.6 kg)   SpO2 97%   BMI 21.11 kg/m   Deferred -->left AMA See assessment from Dr Isaiah Serge earlier today   Labs at discharge Lab Results  Component Value Date   CREATININE 1.01 09/01/2016   BUN 13 09/01/2016   NA 138 09/01/2016   K 3.8 09/01/2016   CL 109 09/01/2016   CO2 23 09/01/2016   Lab Results  Component Value Date   WBC 15.3 (H) 09/01/2016   HGB 12.7 (L) 09/01/2016   HCT 37.6 (L) 09/01/2016   MCV 91.0 09/01/2016   PLT 258  09/01/2016   Lab Results  Component Value Date   ALT 48 08/31/2016   AST 40 08/31/2016   ALKPHOS 76 08/31/2016   BILITOT 0.7 08/31/2016   No results found for: INR, PROTIME  Current radiology studies Ct Head Wo Contrast  Result Date: 08/31/2016 CLINICAL DATA:  26 y/o  M; witnessed seizure. EXAM: CT HEAD WITHOUT CONTRAST CT CERVICAL SPINE WITHOUT CONTRAST TECHNIQUE: Multidetector CT imaging of the head and cervical spine was performed following the standard protocol without intravenous contrast. Multiplanar CT image reconstructions of the cervical spine were also generated. COMPARISON:  10/08/2011 CT head FINDINGS: CT HEAD FINDINGS Brain: No evidence of acute infarction, hemorrhage, hydrocephalus, extra-axial collection or mass lesion/mass effect. Vascular: No hyperdense vessel or unexpected calcification. Skull: Left parietal scalp soft tissue thickening compatible with contusion. No displaced calvarial fracture. Sinuses/Orbits: No acute finding. Other: None. CT CERVICAL SPINE FINDINGS Alignment: Straightening of cervical lordosis with mild reversal of curvature at C5-6. No listhesis. Skull base and vertebrae: No acute fracture. No primary bone lesion or focal pathologic process. Soft tissues and spinal canal: No prevertebral fluid or swelling. No visible canal hematoma. Disc levels:  No significant cervical degenerative changes. Upper chest: Negative. Other: Endotracheal tube and enteric tubes extend below the field of view. IMPRESSION: CT head: 1. Left parietal scalp soft tissue thickening compatible with contusion. No displaced calvarial fracture. 2. No acute intracranial abnormality. 3. Otherwise unremarkable  CT of the head. CT cervical spine: No acute fracture or dislocation. Unremarkable CT of cervical spine. Electronically Signed   By: Mitzi Hansen M.D.   On: 08/31/2016 21:37   Ct Cervical Spine Wo Contrast  Result Date: 08/31/2016 CLINICAL DATA:  26 y/o  M; witnessed seizure.  EXAM: CT HEAD WITHOUT CONTRAST CT CERVICAL SPINE WITHOUT CONTRAST TECHNIQUE: Multidetector CT imaging of the head and cervical spine was performed following the standard protocol without intravenous contrast. Multiplanar CT image reconstructions of the cervical spine were also generated. COMPARISON:  10/08/2011 CT head FINDINGS: CT HEAD FINDINGS Brain: No evidence of acute infarction, hemorrhage, hydrocephalus, extra-axial collection or mass lesion/mass effect. Vascular: No hyperdense vessel or unexpected calcification. Skull: Left parietal scalp soft tissue thickening compatible with contusion. No displaced calvarial fracture. Sinuses/Orbits: No acute finding. Other: None. CT CERVICAL SPINE FINDINGS Alignment: Straightening of cervical lordosis with mild reversal of curvature at C5-6. No listhesis. Skull base and vertebrae: No acute fracture. No primary bone lesion or focal pathologic process. Soft tissues and spinal canal: No prevertebral fluid or swelling. No visible canal hematoma. Disc levels:  No significant cervical degenerative changes. Upper chest: Negative. Other: Endotracheal tube and enteric tubes extend below the field of view. IMPRESSION: CT head: 1. Left parietal scalp soft tissue thickening compatible with contusion. No displaced calvarial fracture. 2. No acute intracranial abnormality. 3. Otherwise unremarkable CT of the head. CT cervical spine: No acute fracture or dislocation. Unremarkable CT of cervical spine. Electronically Signed   By: Mitzi Hansen M.D.   On: 08/31/2016 21:37   Dg Chest Port 1 View  Result Date: 09/01/2016 CLINICAL DATA:  Hx of seizures, asthma. Patient on vent. Smoker. EXAM: PORTABLE CHEST 1 VIEW COMPARISON:  08/31/2016 FINDINGS: There is new consolidation in the left lung base which is most likely atelectasis. Remainder of the lungs is clear. No pneumothorax. No convincing pleural effusion. Endotracheal tube and nasogastric tube are well positioned and stable.  IMPRESSION: 1. New left lung base opacity, consistent with atelectasis or pneumonia. 2. No other change. Support apparatus is stable and well positioned. Electronically Signed   By: Amie Portland M.D.   On: 09/01/2016 07:22   Dg Chest Port 1 View  Result Date: 08/31/2016 CLINICAL DATA:  Altered mental status.  Altered mental status. EXAM: PORTABLE CHEST 1 VIEW COMPARISON:  Radiograph 10/22/2009 FINDINGS: Endotracheal tube at the thoracic inlet 5.7 cm from the carina. Tip and side-port of the enteric tube below the diaphragm in the stomach. The cardiomediastinal contours are normal. The lungs are clear. Pulmonary vasculature is normal. No consolidation, pleural effusion, or pneumothorax. No acute osseous abnormalities are seen. IMPRESSION: 1. Endotracheal tube at the thoracic inlet 5.7 cm from the carina. Enteric tube in place. 2. Clear lungs. Electronically Signed   By: Rubye Oaks M.D.   On: 08/31/2016 20:34    Disposition:  01-Home or Self Care  Discharge Instructions    Diet - low sodium heart healthy    Complete by:  As directed    Discharge instructions    Complete by:  As directed    You are leaving against medical advice You may NOT drive for 6 months.  You need to re-establish w/ [facility] as soon as possible.   Increase activity slowly    Complete by:  As directed      Allergies as of 09/01/2016   No Known Allergies     Medication List    STOP taking these medications  divalproex 500 MG 24 hr tablet Commonly known as:  DEPAKOTE ER     TAKE these medications   levETIRAcetam 500 MG tablet Commonly known as:  KEPPRA Take 1 tablet (500 mg total) by mouth 2 (two) times daily.        Discharged Condition: left against medical advice   Physician Statement:   The Patient was personally examined, the discharge assessment and plan has been personally reviewed and I agree with ACNP Zavior Thomason's assessment and plan. 31 minutes of time have been  dedicated to discharge assessment, planning and discharge instructions.   Signed: Shelby Mattocksete E Marwa Fuhrman 09/01/2016, 3:21 PM

## 2016-09-01 NOTE — Progress Notes (Signed)
Spoke with patient, mother at bedside.  Patient states he is not staying any longer, that he is better and he "needs a smoke".  Explained to patient the important of his hospital stay and why he is here.  He stated that he knows why and doesn't remember the seizure but he knows he is better.  After conversation, contacted Dr. Isaiah SergeMannam about patients decision.  Was told he wont be able to see the patient for 1-2 hrs.  Relayed message to pt and pt's mother.  Patient stated, "In a hr or so, I'm gone, regardless."  Mother agreed with patient.  Will continue to monitor patient.

## 2016-09-05 LAB — CULTURE, BLOOD (ROUTINE X 2)
CULTURE: NO GROWTH
Culture: NO GROWTH
SPECIAL REQUESTS: ADEQUATE
Special Requests: ADEQUATE

## 2016-09-12 LAB — URINE DRUGS OF ABUSE SCREEN W ALC, ROUTINE (REF LAB)
Amphetamines, Urine: NEGATIVE ng/mL
BARBITURATE, UR: NEGATIVE ng/mL
COCAINE (METAB.): NEGATIVE ng/mL
Ethanol U, Quan: NEGATIVE %
Methadone Screen, Urine: NEGATIVE ng/mL
Phencyclidine, Ur: NEGATIVE ng/mL
Propoxyphene, Urine: NEGATIVE ng/mL

## 2016-09-12 LAB — PANEL 799049: Cannabinoid GC/MS, Ur: POSITIVE — AB

## 2016-09-12 LAB — DRUG PROFILE 799031: BENZODIAZEPINES: NEGATIVE

## 2016-09-12 LAB — OPIATES CONFIRMATION, URINE: OPIATES: NEGATIVE

## 2019-04-16 ENCOUNTER — Other Ambulatory Visit: Payer: Self-pay

## 2019-04-16 ENCOUNTER — Emergency Department: Payer: Self-pay

## 2019-04-16 DIAGNOSIS — M659 Synovitis and tenosynovitis, unspecified: Secondary | ICD-10-CM | POA: Insufficient documentation

## 2019-04-16 DIAGNOSIS — J45909 Unspecified asthma, uncomplicated: Secondary | ICD-10-CM | POA: Insufficient documentation

## 2019-04-16 DIAGNOSIS — L03012 Cellulitis of left finger: Secondary | ICD-10-CM | POA: Insufficient documentation

## 2019-04-16 DIAGNOSIS — Z79899 Other long term (current) drug therapy: Secondary | ICD-10-CM | POA: Insufficient documentation

## 2019-04-16 DIAGNOSIS — F17210 Nicotine dependence, cigarettes, uncomplicated: Secondary | ICD-10-CM | POA: Insufficient documentation

## 2019-04-16 LAB — CBC
HCT: 41.9 % (ref 39.0–52.0)
Hemoglobin: 15 g/dL (ref 13.0–17.0)
MCH: 31.3 pg (ref 26.0–34.0)
MCHC: 35.8 g/dL (ref 30.0–36.0)
MCV: 87.5 fL (ref 80.0–100.0)
Platelets: 354 10*3/uL (ref 150–400)
RBC: 4.79 MIL/uL (ref 4.22–5.81)
RDW: 11.6 % (ref 11.5–15.5)
WBC: 13 10*3/uL — ABNORMAL HIGH (ref 4.0–10.5)
nRBC: 0 % (ref 0.0–0.2)

## 2019-04-16 NOTE — ED Triage Notes (Signed)
Pt brought in by EMS with co infection to left 4th finger. States noticed mall "bump" 2 days ago and cut it at home with a knife to try to drain it. States did improve after that but today he hit same area with hammer by accident, now has large amount of redness with drainage noted.

## 2019-04-17 ENCOUNTER — Emergency Department
Admission: EM | Admit: 2019-04-17 | Discharge: 2019-04-17 | Discharge status: Against Medical Advice | Payer: Self-pay | Attending: Emergency Medicine | Admitting: Emergency Medicine

## 2019-04-17 DIAGNOSIS — M659 Synovitis and tenosynovitis, unspecified: Secondary | ICD-10-CM

## 2019-04-17 DIAGNOSIS — L03012 Cellulitis of left finger: Secondary | ICD-10-CM

## 2019-04-17 LAB — COMPREHENSIVE METABOLIC PANEL
ALT: 22 U/L (ref 0–44)
AST: 18 U/L (ref 15–41)
Albumin: 4.3 g/dL (ref 3.5–5.0)
Alkaline Phosphatase: 64 U/L (ref 38–126)
Anion gap: 9 (ref 5–15)
BUN: 15 mg/dL (ref 6–20)
CO2: 21 mmol/L — ABNORMAL LOW (ref 22–32)
Calcium: 8.8 mg/dL — ABNORMAL LOW (ref 8.9–10.3)
Chloride: 106 mmol/L (ref 98–111)
Creatinine, Ser: 0.91 mg/dL (ref 0.61–1.24)
GFR calc Af Amer: >60 mL/min (ref >60)
GFR calc non Af Amer: >60 mL/min (ref >60)
Glucose, Bld: 101 mg/dL — ABNORMAL HIGH (ref 70–99)
Potassium: 3.7 mmol/L (ref 3.5–5.1)
Sodium: 136 mmol/L (ref 135–145)
Total Bilirubin: 0.8 mg/dL (ref 0.3–1.2)
Total Protein: 7.3 g/dL (ref 6.5–8.1)

## 2019-04-17 MED ORDER — ONDANSETRON HCL 4 MG/2ML IJ SOLN: 4.0000 mg | Freq: Once | INTRAMUSCULAR | Status: AC

## 2019-04-17 MED ORDER — KETOROLAC TROMETHAMINE 30 MG/ML IJ SOLN
30.0000 mg | Freq: Once | INTRAMUSCULAR | Status: AC
  Administered 2019-04-17: 01:00:00 30 mg via INTRAVENOUS
  Filled 2019-04-17: qty 1

## 2019-04-17 MED ORDER — ONDANSETRON HCL 4 MG/2ML IJ SOLN
INTRAMUSCULAR | Status: AC
  Administered 2019-04-17: 01:00:00 4 mg via INTRAVENOUS
  Filled 2019-04-17: qty 2

## 2019-04-17 MED ORDER — VANCOMYCIN HCL IN DEXTROSE 1-5 GM/200ML-% IV SOLN
1000.0000 mg | Freq: Once | INTRAVENOUS | Status: AC
  Administered 2019-04-17: 01:00:00 1000 mg via INTRAVENOUS
  Filled 2019-04-17: qty 200

## 2019-04-17 MED ORDER — MORPHINE SULFATE (PF) 4 MG/ML IV SOLN: 4.0000 mg | Freq: Once | INTRAVENOUS | Status: DC

## 2019-04-17 MED ORDER — LIDOCAINE HCL (PF) 1 % IJ SOLN: 5.0000 mL | Freq: Once | INTRAMUSCULAR | Status: AC

## 2019-04-17 MED ORDER — BACITRACIN ZINC 500 UNIT/GM EX OINT
TOPICAL_OINTMENT | Freq: Once | CUTANEOUS | Status: AC
  Administered 2019-04-17: 1 via TOPICAL
  Filled 2019-04-17: qty 0.9

## 2019-04-17 MED ORDER — LIDOCAINE HCL (PF) 1 % IJ SOLN
INTRAMUSCULAR | Status: AC
  Administered 2019-04-17: 02:00:00 5 mL via INTRADERMAL
  Filled 2019-04-17: qty 5

## 2019-04-17 MED ORDER — MORPHINE SULFATE (PF) 4 MG/ML IV SOLN
INTRAVENOUS | Status: AC
  Filled 2019-04-17: qty 1

## 2019-04-17 MED ORDER — PIPERACILLIN-TAZOBACTAM 3.375 G IVPB 30 MIN
3.3750 g | Freq: Once | INTRAVENOUS | Status: AC
  Administered 2019-04-17: 01:00:00 3.375 g via INTRAVENOUS
  Filled 2019-04-17: qty 50

## 2019-04-17 MED ORDER — SULFAMETHOXAZOLE-TRIMETHOPRIM 800-160 MG PO TABS: 2.0000 | ORAL_TABLET | Freq: Two times a day (BID) | ORAL | 0 refills | Status: AC

## 2019-04-17 NOTE — ED Provider Notes (Signed)
[facility] Emergency Department Provider Note  ____________________________________________   First MD Initiated Contact with Patient 04/17/19 [phone]     (approximate)  I have reviewed the triage vital signs and the nursing notes.   HISTORY  Chief Complaint Wound Infection    HPI Jared Gay is a 29 y.o. male with below list of previous medical conditions presents to the emergency department secondary to left ring finger "infection".  Patient states 2 days ago he noticed a ingrown hair on his left ring finger which he cut with his knife while at work.  Patient states that since then area has become increasingly swollen painful with associated redness.        Past Medical History:  Diagnosis Date  . Asthma   . Gastritis   . Seizures [facility])     Patient Active Problem List   Diagnosis Date Noted  . Seizure ([place]) 08/31/2016  . Generalized convulsive epilepsy without mention of intractable epilepsy 08/14/2012  . Transient alteration of awareness 08/14/2012  . Encounter for therapeutic drug monitoring 08/14/2012  . Acute gastritis 01/13/2011  . Dehydration 01/13/2011  . Electrolyte abnormality 01/13/2011  . Seizure disorder ([place]) 01/13/2011  . Active smoker 01/13/2011    Past Surgical History:  Procedure Laterality Date  . TONSILLECTOMY      Prior to Admission medications   Medication Sig Start Date End Date Taking? Authorizing Provider  levETIRAcetam (KEPPRA) 500 MG tablet Take 1 tablet (500 mg total) by mouth 2 (two) times daily. 09/01/16   Erick Colace, NP    Allergies Patient has no known allergies.  Family History  Problem Relation Age of Onset  . Colon cancer Neg Hx     Social History Social History   Tobacco Use  . Smoking status: Current Every Day Smoker    Packs/day: 0.50    Types: Cigarettes  . Smokeless tobacco: Never Used  Substance Use Topics  . Alcohol use: Yes    Alcohol/week: 1.0 - 2.0 standard drinks   Types: 1 - 2 Cans of beer per week    Comment: weekends only if that   . Drug use: Yes    Frequency: 1.0 times per week    Types: Marijuana    Review of Systems Constitutional: No fever/chills Eyes: No visual changes. ENT: No sore throat. Cardiovascular: Denies chest pain. Respiratory: Denies shortness of breath. Gastrointestinal: No abdominal pain.  No nausea, no vomiting.  No diarrhea.  No constipation. Genitourinary: Negative for dysuria. Musculoskeletal: Negative for neck pain.  Negative for back pain.  Positive for left ring finger pain swelling redness and drainage Integumentary: Negative for rash. Neurological: Negative for headaches, focal weakness or numbness.  ____________________________________________   PHYSICAL EXAM:  VITAL SIGNS: ED Triage Vitals [04/16/19 2338]  Enc Vitals Group     BP (!) 159/81     Pulse Rate (!) 116     Resp 20     Temp 98.4 F (36.9 C)     Temp Source Oral     SpO2 100 %     Weight 72.6 kg (160 lb)     Height 1.829 m (6')     Head Circumference      Peak Flow      Pain Score 10     Pain Loc      Pain Edu?      Excl. in [place]?     Constitutional: Alert and oriented.  Apparent discomfort.   Eyes: Conjunctivae are normal.  Mouth/Throat: Patient is wearing a mask. Neck: No stridor.  No meningeal signs.   Cardiovascular: Normal rate, regular rhythm. Good peripheral circulation. Grossly normal heart sounds. Respiratory: Normal respiratory effort.  No retractions. Gastrointestinal: Soft and nontender. No distention.  Musculoskeletal: Marked circumferential swelling of the left ring finger extending from the metacarpal phalangeal joint to the PIP joint with wound to the dorsal aspect with scant purulent drainage.  Finger held in flexion.  Discomfort with attempts to extend the finger Neurologic:  Normal speech and language. No gross focal neurologic deficits are appreciated.  Skin:  Skin is warm, dry and intact. Psychiatric: Very bizarre  affect ____________________________________________   LABS (all labs ordered are listed, but only abnormal results are displayed)  Labs Reviewed  CBC - Abnormal; Notable for the following components:      Result Value   WBC 13.0 (*)    All other components within normal limits  COMPREHENSIVE METABOLIC PANEL - Abnormal; Notable for the following components:   CO2 21 (*)    Glucose, Bld 101 (*)    Calcium 8.8 (*)    All other components within normal limits  URINE DRUG SCREEN, QUALITATIVE ([facility] ONLY)   _______________________________________  RADIOLOGY I, Buchanan Dewayne Shorter, personally viewed and evaluated these images (plain radiographs) as part of my medical decision making, as well as reviewing the written report by the radiologist.  ED MD interpretation: No definite acute osseous abnormality noted on x-ray  Official radiology report(s): DG Hand Complete Left  Result Date: 04/17/2019 CLINICAL DATA:  Injury infection to fourth finger EXAM: LEFT HAND - COMPLETE 3+ VIEW COMPARISON:  None. FINDINGS: Limited by flexed positioning of the digits. No fracture or malalignment. No radiopaque foreign body IMPRESSION: Limited by positioning.  No definite acute osseous abnormality Electronically Signed   By: Jared Gay M.D.   On: 04/17/2019 00:02    ____________________________________________   PROCEDURES   .Marland KitchenIncision and Drainage  Date/Time: 04/17/2019 4:04 AM Performed by: Darci Current, MD Authorized by: Darci Current, MD   Consent:    Consent obtained:  Verbal   Consent given by:  Patient   Risks discussed:  Bleeding, infection, incomplete drainage and pain   Alternatives discussed:  Alternative treatment, delayed treatment and observation Location:    Type:  Abscess   Location:  Upper extremity   Upper extremity location:  Finger   Finger location:  L ring finger Pre-procedure details:    Skin preparation:  Betadine Anesthesia (see MAR for exact dosages):     Anesthesia method:  Local infiltration   Local anesthetic:  Lidocaine 1% w/o epi Procedure type:    Complexity:  Complex Procedure details:    Drainage:  Purulent Post-procedure details:    Patient tolerance of procedure:  Tolerated well, no immediate complications     ____________________________________________   INITIAL IMPRESSION / MDM / ASSESSMENT AND PLAN / ED COURSE  As part of my medical decision making, I reviewed the following data within the electronic MEDICAL RECORD NUMBER  29 year old male presented with above-stated history and physical exam consistent with cellulitis of the left ring finger with associated tenosynovitis.  Immediately upon my introduction the patient stated "I am not staying here".  Patient adamantly stating that he will not remain in the hospital.  Patient continued to be verbally abusive to the staff.  After the patient and I had a conversation he agreed to receive "1 dose" of IV antibiotics in the emergency department.  Given the patient's adamant that  he would not be admitted and the potential grave sequelae of his decision, I&D of the patient's ring finger was performed with approximately 10 mL purulent drainage obtained.  Patient received 1 dose of IV vancomycin and Zosyn and will be prescribed Bactrim for home.  I informed the patient that his decision could result in sepsis and explained what that was, death, loss of limb.  Despite my most earnest efforts patient left AGAINST MEDICAL ADVICE   ____________________________________________  FINAL CLINICAL IMPRESSION(S) / ED DIAGNOSES  Final diagnoses:  Tenosynovitis of finger  Cellulitis of finger of left hand     MEDICATIONS GIVEN DURING THIS VISIT:  Medications  ondansetron (ZOFRAN) injection 4 mg (has no administration in time range)  morphine 4 MG/ML injection 4 mg (has no administration in time range)     ED Discharge Orders    None      *Please note:  Jared Gay was evaluated in  Emergency Department on 04/17/2019 for the symptoms described in the history of present illness. He was evaluated in the context of the global COVID-19 pandemic, which necessitated consideration that the patient might be at risk for infection with the SARS-CoV-2 virus that causes COVID-19. Institutional protocols and algorithms that pertain to the evaluation of patients at risk for COVID-19 are in a state of rapid change based on information released by regulatory bodies including the CDC and federal and state organizations. These policies and algorithms were followed during the patient's care in the ED.  Some ED evaluations and interventions may be delayed as a result of limited staffing during the pandemic.*  Note:  This document was prepared using Dragon voice recognition software and may include unintentional dictation errors.   Darci Current, MD 04/17/19 [phone]

## 2019-04-17 NOTE — ED Notes (Signed)
Attempted to get urine sample from pt. PT was standing at toilet and attempting to undo pants when he stated he felt like he was going to pass out. RN instructed pt to walk back to the bed if he felt he was going to pass out. PT did not comply. RN scooted a chair behind pt and he sat down in the chair still shaking and saying he had no control of his arm.

## 2019-04-17 NOTE — ED Notes (Signed)
Upon entering room, pt found to be sitting on the floor. Dr. Manson Passey assisted pt to the bed. PT shaking and refusing to open eyes, states he does not have control of his body. PT anxious, will not hold still.

## 2019-04-17 NOTE — ED Notes (Signed)
This RN to assist pt. Pt laying on bench with legs extended voluntarily shaking the left arm. Pt encouraged to get back in bed to avoid falling and causing injury. Pt states he is having difficulty controlling his body. Pt unable to verbalize the cause. Shaking of the right hand is intermittent. Pt is able to stand without assistance but is dragging his leg. Behavior is irregular. Pt placed back in bed and rails up for safety. Pt keeps his eyes closed during the encounter. Pt laying in bed and then begins to yell obscenities and threaten the staff. Pt instructed to give a urine specimen in the urinal on the rail and this RN left. MD and primary RN notified of pt being aggressive.

## 2019-04-17 NOTE — ED Notes (Signed)
Pt soaking finger in betadine mixture

## 2023-08-13 DIAGNOSIS — F1220 Cannabis dependence, uncomplicated: Secondary | ICD-10-CM | POA: Diagnosis not present

## 2023-08-27 DIAGNOSIS — F1220 Cannabis dependence, uncomplicated: Secondary | ICD-10-CM | POA: Diagnosis not present

## 2023-09-17 DIAGNOSIS — F1220 Cannabis dependence, uncomplicated: Secondary | ICD-10-CM | POA: Diagnosis not present

## 2023-09-24 DIAGNOSIS — F1220 Cannabis dependence, uncomplicated: Secondary | ICD-10-CM | POA: Diagnosis not present

## 2023-10-15 DIAGNOSIS — F1220 Cannabis dependence, uncomplicated: Secondary | ICD-10-CM | POA: Diagnosis not present

## 2023-10-30 DIAGNOSIS — F1220 Cannabis dependence, uncomplicated: Secondary | ICD-10-CM | POA: Diagnosis not present

## 2023-11-05 DIAGNOSIS — F1220 Cannabis dependence, uncomplicated: Secondary | ICD-10-CM | POA: Diagnosis not present

## 2023-11-26 DIAGNOSIS — F1220 Cannabis dependence, uncomplicated: Secondary | ICD-10-CM | POA: Diagnosis not present

## 2023-12-03 DIAGNOSIS — F1220 Cannabis dependence, uncomplicated: Secondary | ICD-10-CM | POA: Diagnosis not present
